# Patient Record
Sex: Male | Born: 1955 | Race: White | Hispanic: No | Marital: Single | Smoking: Never smoker
Health system: Southern US, Community
[De-identification: ages and names within clinical notes are randomized; demographics above are authoritative.]

## PROBLEM LIST (undated history)

## (undated) DIAGNOSIS — T884XXA Failed or difficult intubation, initial encounter: Secondary | ICD-10-CM

## (undated) DIAGNOSIS — Z8719 Personal history of other diseases of the digestive system: Secondary | ICD-10-CM

## (undated) DIAGNOSIS — K219 Gastro-esophageal reflux disease without esophagitis: Secondary | ICD-10-CM

## (undated) DIAGNOSIS — G4730 Sleep apnea, unspecified: Secondary | ICD-10-CM

## (undated) DIAGNOSIS — G4762 Sleep related leg cramps: Secondary | ICD-10-CM

## (undated) DIAGNOSIS — C801 Malignant (primary) neoplasm, unspecified: Secondary | ICD-10-CM

## (undated) DIAGNOSIS — G629 Polyneuropathy, unspecified: Secondary | ICD-10-CM

## (undated) HISTORY — DX: Sleep related leg cramps: G47.62

## (undated) HISTORY — PX: OTHER SURGICAL HISTORY: SHX169

## (undated) HISTORY — DX: Polyneuropathy, unspecified: G62.9

## (undated) HISTORY — PX: HIATAL HERNIA REPAIR: SHX195

---

## 1998-08-16 ENCOUNTER — Emergency Department (HOSPITAL_COMMUNITY): Admission: EM | Admit: 1998-08-16 | Discharge: 1998-08-16 | Payer: Self-pay | Admitting: Emergency Medicine

## 1998-08-16 ENCOUNTER — Encounter: Payer: Self-pay | Admitting: Emergency Medicine

## 1998-09-01 ENCOUNTER — Ambulatory Visit (HOSPITAL_COMMUNITY): Admission: RE | Admit: 1998-09-01 | Discharge: 1998-09-01 | Payer: Self-pay

## 1998-12-10 ENCOUNTER — Encounter: Admission: RE | Admit: 1998-12-10 | Discharge: 1999-01-26 | Payer: Self-pay

## 2002-03-14 ENCOUNTER — Ambulatory Visit (HOSPITAL_BASED_OUTPATIENT_CLINIC_OR_DEPARTMENT_OTHER): Admission: RE | Admit: 2002-03-14 | Discharge: 2002-03-14 | Payer: Self-pay | Admitting: Geriatric Medicine

## 2003-05-06 ENCOUNTER — Ambulatory Visit (HOSPITAL_BASED_OUTPATIENT_CLINIC_OR_DEPARTMENT_OTHER): Admission: RE | Admit: 2003-05-06 | Discharge: 2003-05-06 | Payer: Self-pay | Admitting: Geriatric Medicine

## 2006-07-13 ENCOUNTER — Emergency Department (HOSPITAL_COMMUNITY): Admission: EM | Admit: 2006-07-13 | Discharge: 2006-07-14 | Payer: Self-pay | Admitting: Emergency Medicine

## 2007-04-25 ENCOUNTER — Encounter: Admission: RE | Admit: 2007-04-25 | Discharge: 2007-04-25 | Payer: Self-pay

## 2007-08-27 ENCOUNTER — Ambulatory Visit (HOSPITAL_COMMUNITY): Admission: RE | Admit: 2007-08-27 | Discharge: 2007-08-27 | Payer: Self-pay | Admitting: Neurosurgery

## 2009-03-27 IMAGING — CT CT ANGIO HEAD
2 of 9 series · 6 of 33 positions shown · IV contrast (APPLIED)
Comparison: None.

CLINICAL DATA: EVAL: ANEURSYM/PT HAVING HEADACHES/FAMILY HX
ANEURSYM;

CT ANGIOGRAPHY HEAD
TECHNIQUE: Multidetector CT imaging of the brain was performed
during bolus injection of intravenous contrast. Multiplanar CT
angiographic image reconstructions were generated to evaluate
cerebral vasculature centered at the Circle of Willis
Contrast: Omnipaque 350, 100 ml

[Series 3: head w/o 4.8 h47s · axial · non-contrast · 0.47mm/px · z∈[-129,+19]mm · 3 of 30 slices shown]
[im 1/30  soft-tissue]
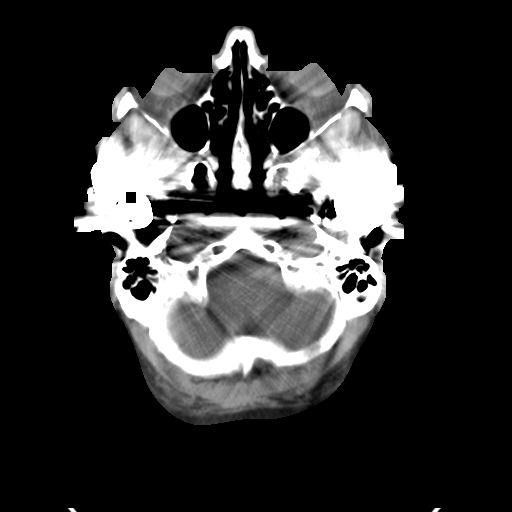
[im 15/30  bone]
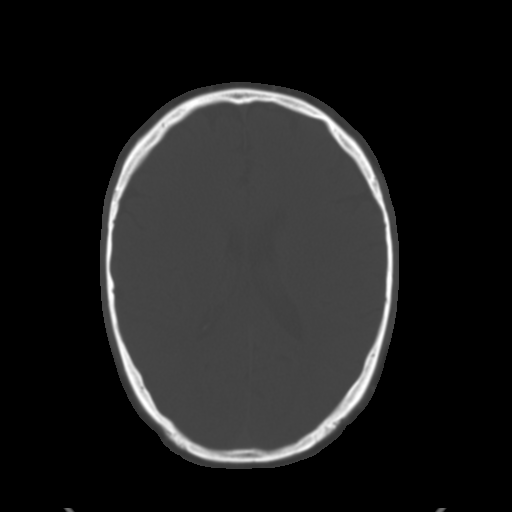
[im 30/30  soft-tissue]
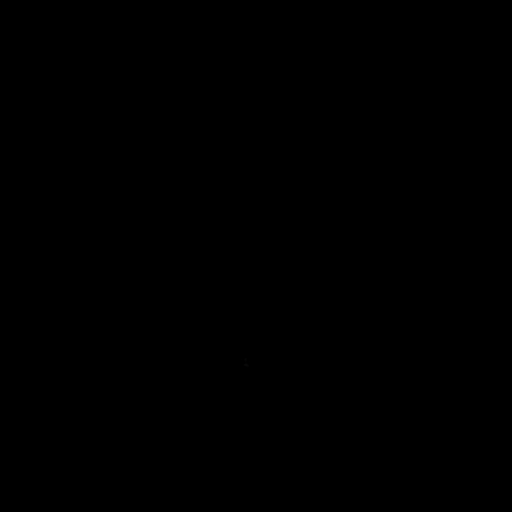

[Series 12: angio 2mm · axial · 0.43mm/px · z∈[-246,-186]mm · 3 of 62 slices shown]
[im 16/62  soft-tissue]
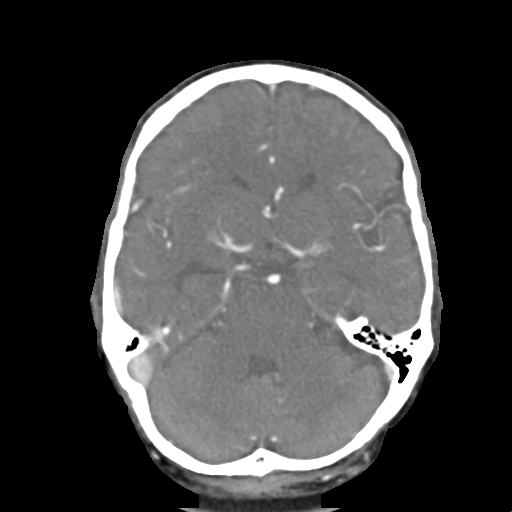
[im 31/62  soft-tissue]
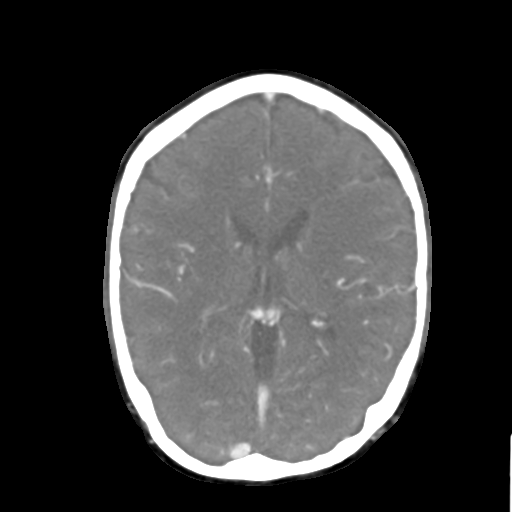
[im 46/62  soft-tissue]
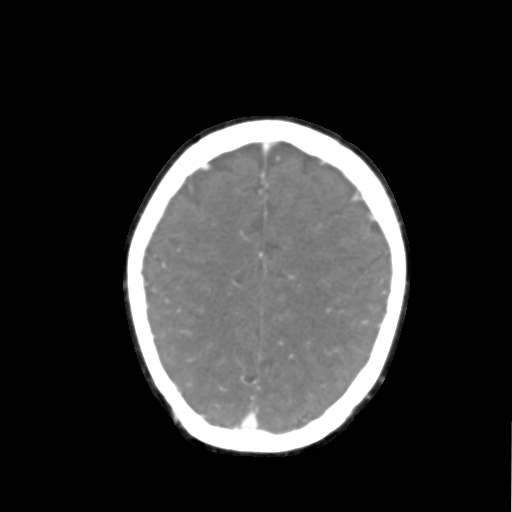

[6 of 33 positions shown; findings below may reference images not displayed]

FINDINGS: The patient has bilateral temporomandibular joint
prostheses, which required a slight modification and the technique
for CT angiography, in that the gantry was angled slightly.
Overall the study is diagnostic, although there is some mild
artifact obscuring the distal vertebral arteries near the skull
base.

There is marked the dolichoectasia of the vertebrobasilar system,
with the basilar artery projecting well into the right prepontine
cistern.  There is no visible aneurysm, or arteriovenous
malformation.  There is premature vascular calcification in the
carotid siphon regions bilaterally, given the patient's age of 51.
I see no visible proximal stenosis of the intracranial vasculature.

 Routine imaging the brain pre and post contrast demonstrates no
evidence for acute infarct, mass lesion, hydrocephalus, hemorrhage,
or extra-axial fluid.  There does appear to be mild atrophy
premature for the patient's age.  There is slight prominence of the
right temporal horns compared to the left, perhaps representing
asymmetric right temporal lobe volume loss.  There is no abnormal
enhancement post infusion.
IMPRESSION: Technically adequate study despite limitations related to bilateral
TMJ prosthesis.

No visible intracranial aneurysm or vascular malformation.

Moderate to marked dolichoectasia of the vertebral basilar system,
as well as premature atherosclerotic calcification of the carotid
siphons.

Mild premature atrophy, with asymmetric prominence of the right
temporal horn also noted.

## 2010-06-20 ENCOUNTER — Encounter: Payer: Self-pay | Admitting: Geriatric Medicine

## 2012-05-30 HISTORY — PX: LUMBAR DISC SURGERY: SHX700

## 2015-09-02 DIAGNOSIS — C44311 Basal cell carcinoma of skin of nose: Secondary | ICD-10-CM | POA: Diagnosis not present

## 2015-09-03 DIAGNOSIS — C44311 Basal cell carcinoma of skin of nose: Secondary | ICD-10-CM | POA: Diagnosis not present

## 2015-09-11 DIAGNOSIS — C4491 Basal cell carcinoma of skin, unspecified: Secondary | ICD-10-CM | POA: Diagnosis not present

## 2015-09-14 DIAGNOSIS — M950 Acquired deformity of nose: Secondary | ICD-10-CM | POA: Diagnosis not present

## 2015-09-14 DIAGNOSIS — Z85828 Personal history of other malignant neoplasm of skin: Secondary | ICD-10-CM | POA: Diagnosis not present

## 2015-09-18 DIAGNOSIS — C4491 Basal cell carcinoma of skin, unspecified: Secondary | ICD-10-CM | POA: Diagnosis not present

## 2015-09-18 DIAGNOSIS — M950 Acquired deformity of nose: Secondary | ICD-10-CM | POA: Diagnosis not present

## 2015-09-22 DIAGNOSIS — C4491 Basal cell carcinoma of skin, unspecified: Secondary | ICD-10-CM | POA: Diagnosis not present

## 2015-09-22 DIAGNOSIS — M950 Acquired deformity of nose: Secondary | ICD-10-CM | POA: Diagnosis not present

## 2015-09-24 DIAGNOSIS — C4491 Basal cell carcinoma of skin, unspecified: Secondary | ICD-10-CM | POA: Diagnosis not present

## 2015-09-24 DIAGNOSIS — M950 Acquired deformity of nose: Secondary | ICD-10-CM | POA: Diagnosis not present

## 2015-09-28 DIAGNOSIS — C4491 Basal cell carcinoma of skin, unspecified: Secondary | ICD-10-CM | POA: Diagnosis not present

## 2015-09-28 DIAGNOSIS — M950 Acquired deformity of nose: Secondary | ICD-10-CM | POA: Diagnosis not present

## 2015-10-05 DIAGNOSIS — M950 Acquired deformity of nose: Secondary | ICD-10-CM | POA: Diagnosis not present

## 2015-10-05 DIAGNOSIS — C4491 Basal cell carcinoma of skin, unspecified: Secondary | ICD-10-CM | POA: Diagnosis not present

## 2015-10-13 DIAGNOSIS — Z79899 Other long term (current) drug therapy: Secondary | ICD-10-CM | POA: Diagnosis not present

## 2015-10-13 DIAGNOSIS — E7800 Pure hypercholesterolemia, unspecified: Secondary | ICD-10-CM | POA: Diagnosis not present

## 2015-10-13 DIAGNOSIS — Z125 Encounter for screening for malignant neoplasm of prostate: Secondary | ICD-10-CM | POA: Diagnosis not present

## 2015-10-13 DIAGNOSIS — Z23 Encounter for immunization: Secondary | ICD-10-CM | POA: Diagnosis not present

## 2015-10-13 DIAGNOSIS — Z0000 Encounter for general adult medical examination without abnormal findings: Secondary | ICD-10-CM | POA: Diagnosis not present

## 2015-10-13 DIAGNOSIS — C4491 Basal cell carcinoma of skin, unspecified: Secondary | ICD-10-CM | POA: Diagnosis not present

## 2015-10-13 DIAGNOSIS — M950 Acquired deformity of nose: Secondary | ICD-10-CM | POA: Diagnosis not present

## 2015-10-27 DIAGNOSIS — M950 Acquired deformity of nose: Secondary | ICD-10-CM | POA: Diagnosis not present

## 2015-10-27 DIAGNOSIS — C4491 Basal cell carcinoma of skin, unspecified: Secondary | ICD-10-CM | POA: Diagnosis not present

## 2015-10-30 DIAGNOSIS — H01004 Unspecified blepharitis left upper eyelid: Secondary | ICD-10-CM | POA: Diagnosis not present

## 2015-10-30 DIAGNOSIS — H5203 Hypermetropia, bilateral: Secondary | ICD-10-CM | POA: Diagnosis not present

## 2015-10-30 DIAGNOSIS — H53002 Unspecified amblyopia, left eye: Secondary | ICD-10-CM | POA: Diagnosis not present

## 2015-10-30 DIAGNOSIS — H01001 Unspecified blepharitis right upper eyelid: Secondary | ICD-10-CM | POA: Diagnosis not present

## 2015-11-23 DIAGNOSIS — C4491 Basal cell carcinoma of skin, unspecified: Secondary | ICD-10-CM | POA: Diagnosis not present

## 2015-11-23 DIAGNOSIS — M950 Acquired deformity of nose: Secondary | ICD-10-CM | POA: Diagnosis not present

## 2015-12-22 DIAGNOSIS — C4491 Basal cell carcinoma of skin, unspecified: Secondary | ICD-10-CM | POA: Diagnosis not present

## 2015-12-22 DIAGNOSIS — M950 Acquired deformity of nose: Secondary | ICD-10-CM | POA: Diagnosis not present

## 2016-01-05 DIAGNOSIS — G4733 Obstructive sleep apnea (adult) (pediatric): Secondary | ICD-10-CM | POA: Diagnosis not present

## 2016-01-05 DIAGNOSIS — J343 Hypertrophy of nasal turbinates: Secondary | ICD-10-CM | POA: Diagnosis not present

## 2016-01-05 DIAGNOSIS — J342 Deviated nasal septum: Secondary | ICD-10-CM | POA: Diagnosis not present

## 2016-01-05 DIAGNOSIS — J302 Other seasonal allergic rhinitis: Secondary | ICD-10-CM | POA: Diagnosis not present

## 2016-02-23 DIAGNOSIS — L570 Actinic keratosis: Secondary | ICD-10-CM | POA: Diagnosis not present

## 2016-02-23 DIAGNOSIS — C44219 Basal cell carcinoma of skin of left ear and external auricular canal: Secondary | ICD-10-CM | POA: Diagnosis not present

## 2016-02-23 DIAGNOSIS — Z85828 Personal history of other malignant neoplasm of skin: Secondary | ICD-10-CM | POA: Diagnosis not present

## 2016-02-23 DIAGNOSIS — Z08 Encounter for follow-up examination after completed treatment for malignant neoplasm: Secondary | ICD-10-CM | POA: Diagnosis not present

## 2016-02-23 DIAGNOSIS — X32XXXD Exposure to sunlight, subsequent encounter: Secondary | ICD-10-CM | POA: Diagnosis not present

## 2016-03-30 DIAGNOSIS — L570 Actinic keratosis: Secondary | ICD-10-CM | POA: Diagnosis not present

## 2016-03-30 DIAGNOSIS — L718 Other rosacea: Secondary | ICD-10-CM | POA: Diagnosis not present

## 2016-03-30 DIAGNOSIS — X32XXXD Exposure to sunlight, subsequent encounter: Secondary | ICD-10-CM | POA: Diagnosis not present

## 2016-04-17 DIAGNOSIS — Z23 Encounter for immunization: Secondary | ICD-10-CM | POA: Diagnosis not present

## 2016-04-28 ENCOUNTER — Other Ambulatory Visit: Payer: Self-pay | Admitting: Gastroenterology

## 2016-05-16 ENCOUNTER — Encounter (HOSPITAL_COMMUNITY)
Admission: RE | Disposition: A | Discharge status: Home / Self Care | Payer: Self-pay | Source: Ambulatory Visit | Attending: Gastroenterology

## 2016-05-16 ENCOUNTER — Ambulatory Visit (HOSPITAL_COMMUNITY)
Admission: RE | Admit: 2016-05-16 | Discharge: 2016-05-16 | Disposition: A | Discharge status: Home / Self Care | Payer: BLUE CROSS/BLUE SHIELD | Source: Ambulatory Visit | Attending: Gastroenterology | Admitting: Gastroenterology

## 2016-05-16 ENCOUNTER — Ambulatory Visit (HOSPITAL_COMMUNITY): Payer: BLUE CROSS/BLUE SHIELD | Admitting: Anesthesiology

## 2016-05-16 ENCOUNTER — Encounter (HOSPITAL_COMMUNITY): Payer: Self-pay

## 2016-05-16 DIAGNOSIS — G4730 Sleep apnea, unspecified: Secondary | ICD-10-CM | POA: Diagnosis not present

## 2016-05-16 DIAGNOSIS — E7800 Pure hypercholesterolemia, unspecified: Secondary | ICD-10-CM | POA: Diagnosis not present

## 2016-05-16 DIAGNOSIS — G4733 Obstructive sleep apnea (adult) (pediatric): Secondary | ICD-10-CM | POA: Diagnosis not present

## 2016-05-16 DIAGNOSIS — K219 Gastro-esophageal reflux disease without esophagitis: Secondary | ICD-10-CM | POA: Insufficient documentation

## 2016-05-16 DIAGNOSIS — D125 Benign neoplasm of sigmoid colon: Secondary | ICD-10-CM | POA: Insufficient documentation

## 2016-05-16 DIAGNOSIS — Z1211 Encounter for screening for malignant neoplasm of colon: Secondary | ICD-10-CM | POA: Diagnosis not present

## 2016-05-16 DIAGNOSIS — D126 Benign neoplasm of colon, unspecified: Secondary | ICD-10-CM | POA: Diagnosis not present

## 2016-05-16 HISTORY — DX: Malignant (primary) neoplasm, unspecified: C80.1

## 2016-05-16 HISTORY — DX: Sleep apnea, unspecified: G47.30

## 2016-05-16 HISTORY — DX: Gastro-esophageal reflux disease without esophagitis: K21.9

## 2016-05-16 HISTORY — DX: Personal history of other diseases of the digestive system: Z87.19

## 2016-05-16 HISTORY — PX: COLONOSCOPY WITH PROPOFOL: SHX5780

## 2016-05-16 HISTORY — DX: Failed or difficult intubation, initial encounter: T88.4XXA

## 2016-05-16 SURGERY — COLONOSCOPY WITH PROPOFOL
Anesthesia: Monitor Anesthesia Care

## 2016-05-16 MED ORDER — LACTATED RINGERS IV SOLN
INTRAVENOUS | Status: DC
  Administered 2016-05-16: 13:00:00 via INTRAVENOUS
  Administered 2016-05-16: 1000 mL via INTRAVENOUS

## 2016-05-16 MED ORDER — SODIUM CHLORIDE 0.9 % IV SOLN: INTRAVENOUS | Status: DC

## 2016-05-16 MED ORDER — PROPOFOL 500 MG/50ML IV EMUL
INTRAVENOUS | Status: DC | PRN
  Administered 2016-05-16: 150 ug/kg/min via INTRAVENOUS

## 2016-05-16 MED ORDER — PROPOFOL 10 MG/ML IV BOLUS
INTRAVENOUS | Status: AC
  Filled 2016-05-16: qty 40

## 2016-05-16 MED ORDER — PROPOFOL 500 MG/50ML IV EMUL
INTRAVENOUS | Status: DC | PRN
  Administered 2016-05-16 (×2): 50 mg via INTRAVENOUS

## 2016-05-16 MED ORDER — PROPOFOL 10 MG/ML IV BOLUS
INTRAVENOUS | Status: AC
  Filled 2016-05-16: qty 20

## 2016-05-16 SURGICAL SUPPLY — 22 items

## 2016-05-16 NOTE — Anesthesia Preprocedure Evaluation (Addendum)
Anesthesia Evaluation  Patient identified by MRN, date of birth, ID band Patient awake    Reviewed: Allergy & Precautions, NPO status , Patient's Chart, lab work & pertinent test results  History of Anesthesia Complications (+) DIFFICULT AIRWAYHistory of anesthetic complications: limited mouth opening s/p TMJ surgery.  Airway Mallampati: III   Neck ROM: Full  Mouth opening: Limited Mouth Opening  Dental  (+) Teeth Intact, Dental Advisory Given, Caps   Pulmonary sleep apnea and Continuous Positive Airway Pressure Ventilation ,    breath sounds clear to auscultation       Cardiovascular (-) anginanegative cardio ROS   Rhythm:Regular Rate:Normal     Neuro/Psych negative neurological ROS     GI/Hepatic GERD  Medicated and Controlled,  Endo/Other  negative endocrine ROS  Renal/GU negative Renal ROS     Musculoskeletal   Abdominal   Peds  Hematology negative hematology ROS (+)   Anesthesia Other Findings   Reproductive/Obstetrics                            Anesthesia Physical Anesthesia Plan  ASA: III  Anesthesia Plan: MAC   Post-op Pain Management:    Induction: Intravenous  Airway Management Planned: Natural Airway and Nasal Cannula  Additional Equipment:   Intra-op Plan:   Post-operative Plan:   Informed Consent: I have reviewed the patients History and Physical, chart, labs and discussed the procedure including the risks, benefits and alternatives for the proposed anesthesia with the patient or authorized representative who has indicated his/her understanding and acceptance.   Dental advisory given  Plan Discussed with: CRNA and Surgeon  Anesthesia Plan Comments: (Plan routine monitors, MAC)        Anesthesia Quick Evaluation

## 2016-05-16 NOTE — Op Note (Signed)
[facility] Patient Name: Andrew [name] Procedure Date: 05/16/2016 MRN: [id number] Attending MD: Garlan Fair , MD Date of Birth: [birth year] CSN: [id number] Age: 60 Admit Type: Outpatient Procedure:                Colonoscopy Indications:              Screening for colorectal malignant neoplasm. Normal                            screening colonoscopy was performed on 04/06/2006. Providers:                Garlan Fair, MD, Carolynn Comment, RN, Ralene Bathe, Technician, Arnoldo Hooker, CRNA Referring MD:              Medicines:                Propofol per Anesthesia Complications:            No immediate complications. Estimated Blood Loss:     Estimated blood loss: none. Procedure:                Pre-Anesthesia Assessment:                           - Prior to the procedure, a History and Physical                            was performed, and patient medications and                            allergies were reviewed. The patient's tolerance of                            previous anesthesia was also reviewed. The risks                            and benefits of the procedure and the sedation                            options and risks were discussed with the patient.                            All questions were answered, and informed consent                            was obtained. Prior Anticoagulants: The patient has                            taken aspirin, last dose was 1 day prior to                            procedure. ASA Grade Assessment: II - A patient  with mild systemic disease. After reviewing the                            risks and benefits, the patient was deemed in                            satisfactory condition to undergo the procedure.                           After obtaining informed consent, the colonoscope                            was passed under direct vision. Throughout the                procedure, the patient's blood pressure, pulse, and                            oxygen saturations were monitored continuously. The                            [id number] scope was introduced through                            the anus and advanced to the the cecum, identified                            by appendiceal orifice and ileocecal valve. The                            colonoscopy was performed without difficulty. The                            patient tolerated the procedure well. The quality                            of the bowel preparation was adequate. The                            appendiceal orifice and the rectum were                            photographed. Scope In: 1:29:24 PM Scope Out: 1:55:09 PM Scope Withdrawal Time: 0 hours 18 minutes 37 seconds  Total Procedure Duration: 0 hours 25 minutes 45 seconds  Findings:      The perianal and digital rectal examinations were normal.      A 4 mm polyp was found in the distal sigmoid colon. The polyp was       sessile. The polyp was removed with a cold snare. Resection and       retrieval were complete.      The exam was otherwise without abnormality. Impression:               - One 4 mm polyp in the distal sigmoid colon,  removed with a cold snare. Resected and retrieved.                           - The examination was otherwise normal. Moderate Sedation:      N/A- Per Anesthesia Care Recommendation:           - Patient has a contact number available for                            emergencies. The signs and symptoms of potential                            delayed complications were discussed with the                            patient. Return to normal activities tomorrow.                            Written discharge instructions were provided to the                            patient.                           - Repeat colonoscopy date to be determined after                             pending pathology results are reviewed for                            surveillance.                           - Resume previous diet.                           - Continue present medications. Procedure Code(s):        --- Professional ---                           [phone], Colonoscopy, flexible; with removal of                            tumor(s), polyp(s), or other lesion(s) by snare                            technique Diagnosis Code(s):        --- Professional ---                           Z12.11, Encounter for screening for malignant                            neoplasm of colon                           D12.5, Benign neoplasm of sigmoid colon  CPT copyright 2016 [employer]. All rights reserved. The codes documented in this report are preliminary and upon coder review may  be revised to meet current compliance requirements. Earle Gell, MD Garlan Fair, MD 05/16/2016 2:01:05 PM This report has been signed electronically. Number of Addenda: 0

## 2016-05-16 NOTE — Transfer of Care (Signed)
Immediate Anesthesia Transfer of Care Note  Patient: Andrew [name]  Procedure(s) Performed: Procedure(s): COLONOSCOPY WITH PROPOFOL (N/A)  Patient Location: PACU  Anesthesia Type:MAC  Level of Consciousness:  sedated, patient cooperative and responds to stimulation  Airway & Oxygen Therapy:Patient Spontanous Breathing and Patient connected to face mask oxgen  Post-op Assessment:  Report given to PACU RN and Post -op Vital signs reviewed and stable  Post vital signs:  Reviewed and stable  Last Vitals:  Vitals:   05/16/16 1237  BP: (!) 135/94  Pulse: 83  Resp: 14  Temp: [id number] C    Complications: No apparent anesthesia complications

## 2016-05-16 NOTE — H&P (Signed)
Procedure: Screening colonoscopy. Normal screening colonoscopy was performed on 04/06/2006  History: The patient is a 60 year old male born 02/02/1956. He is scheduled to undergo a repeat screening colonoscopy today  Past medical history: Laparoscopic hernia repair. TMJ surgery with implants. Lumbar disc surgery. Hypercholesterolemia. Allergic rhinitis. Obstructive sleep apnea. Gastroesophageal reflux.  Exam: The patient is alert and lying comfortably on the endoscopy stretcher. Abdomen is soft and nontender to palpation. Lungs are clear to auscultation. Cardiac exam reveals a regular rhythm.  Plan: Proceed with screening colonoscopy

## 2016-05-16 NOTE — Discharge Instructions (Signed)

## 2016-05-16 NOTE — Anesthesia Postprocedure Evaluation (Signed)
Anesthesia Post Note  Patient: Andrew [name]  Procedure(s) Performed: Procedure(s) (LRB): COLONOSCOPY WITH PROPOFOL (N/A)  Patient location during evaluation: Endoscopy Anesthesia Type: MAC Level of consciousness: awake and alert, oriented and patient cooperative Pain management: pain level controlled Vital Signs Assessment: post-procedure vital signs reviewed and stable Respiratory status: spontaneous breathing, nonlabored ventilation and respiratory function stable Cardiovascular status: blood pressure returned to baseline and stable Postop Assessment: no signs of nausea or vomiting Anesthetic complications: no       Last Vitals:  Vitals:   05/16/16 1237 05/16/16 1400  BP: (!) 135/94 135/89  Pulse: 83 76  Resp: 14 16  Temp: 36.8 C 36.5 C    Last Pain:  Vitals:   05/16/16 1400  TempSrc: Oral                 Correen Bubolz,E. Arvon Schreiner

## 2016-05-17 ENCOUNTER — Encounter (HOSPITAL_COMMUNITY): Payer: Self-pay | Admitting: Gastroenterology

## 2016-08-16 DIAGNOSIS — H01001 Unspecified blepharitis right upper eyelid: Secondary | ICD-10-CM | POA: Diagnosis not present

## 2016-08-16 DIAGNOSIS — H04123 Dry eye syndrome of bilateral lacrimal glands: Secondary | ICD-10-CM | POA: Diagnosis not present

## 2016-08-16 DIAGNOSIS — H01004 Unspecified blepharitis left upper eyelid: Secondary | ICD-10-CM | POA: Diagnosis not present

## 2016-08-16 DIAGNOSIS — H01002 Unspecified blepharitis right lower eyelid: Secondary | ICD-10-CM | POA: Diagnosis not present

## 2016-10-14 DIAGNOSIS — Z0000 Encounter for general adult medical examination without abnormal findings: Secondary | ICD-10-CM | POA: Diagnosis not present

## 2016-10-14 DIAGNOSIS — E7800 Pure hypercholesterolemia, unspecified: Secondary | ICD-10-CM | POA: Diagnosis not present

## 2016-10-14 DIAGNOSIS — K219 Gastro-esophageal reflux disease without esophagitis: Secondary | ICD-10-CM | POA: Diagnosis not present

## 2016-10-14 DIAGNOSIS — Z79899 Other long term (current) drug therapy: Secondary | ICD-10-CM | POA: Diagnosis not present

## 2016-10-14 DIAGNOSIS — G4733 Obstructive sleep apnea (adult) (pediatric): Secondary | ICD-10-CM | POA: Diagnosis not present

## 2016-10-14 DIAGNOSIS — Z125 Encounter for screening for malignant neoplasm of prostate: Secondary | ICD-10-CM | POA: Diagnosis not present

## 2016-10-26 DIAGNOSIS — G629 Polyneuropathy, unspecified: Secondary | ICD-10-CM | POA: Diagnosis not present

## 2016-12-05 ENCOUNTER — Ambulatory Visit (INDEPENDENT_AMBULATORY_CARE_PROVIDER_SITE_OTHER): Payer: BLUE CROSS/BLUE SHIELD | Admitting: Neurology

## 2016-12-05 ENCOUNTER — Encounter (INDEPENDENT_AMBULATORY_CARE_PROVIDER_SITE_OTHER): Payer: Self-pay

## 2016-12-05 ENCOUNTER — Encounter: Payer: Self-pay | Admitting: Neurology

## 2016-12-05 DIAGNOSIS — G609 Hereditary and idiopathic neuropathy, unspecified: Secondary | ICD-10-CM | POA: Diagnosis not present

## 2016-12-05 DIAGNOSIS — G629 Polyneuropathy, unspecified: Secondary | ICD-10-CM

## 2016-12-05 HISTORY — DX: Polyneuropathy, unspecified: G62.9

## 2016-12-05 MED ORDER — GABAPENTIN 300 MG PO CAPS: 300.0000 mg | ORAL_CAPSULE | Freq: Two times a day (BID) | ORAL | 3 refills | Status: DC

## 2016-12-05 NOTE — Progress Notes (Addendum)
Reason for visit: Peripheral neuropathy  Referring physician: Dr. Wendie Agreste is a 61 y.o. male  History of present illness:  Mr. 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 The patient had surgery in 2015, he had noted some numbness and tingling sensations in the feet for about a year prior to surgery, but the numbness has worsened since the surgery. The patient has tingling in both feet and some sensory alteration across the ankles in the lower legs. The patient has numbness mainly in the distal portions of the feet on the top and bottoms. The right and the left foot are equally affected. The patient denies any weakness of the legs, he does have some fasciculations that have been present for 8 or 9 years involving the gastrocnemius muscles bilaterally. The patient reports some chronic neck pain, he denies paresthesias in the hands. The patient occasionally may have some left leg spasms that may occur at nighttime. He denies any balance issues or difficulty controlling the bowels or the bladder. He denies any family history of peripheral neuropathies. He did undergo nerve conduction studies done through the [facility] which revealed evidence of normal sensory and motor latencies but the amplitudes for the sensory latencies were slightly low, the F wave latencies were prolonged. The patient was diagnosed with an early peripheral neuropathy. The patient does report some numbness as well as occasional burning and shooting pain, he claims that the discomfort during the day and at night is about equal. He is sent to this office for an evaluation.  Past Medical History:  Diagnosis Date  . Cancer ([place])    skin cancer on nose 2017  . Difficult intubation    Needs to use pediatric intubation due to TMJ surgery  . GERD (gastroesophageal reflux  disease)   . History of hiatal hernia   . Sleep apnea     Past Surgical History:  Procedure Laterality Date  . COLONOSCOPY WITH PROPOFOL N/A 05/16/2016   Procedure: COLONOSCOPY WITH PROPOFOL;  Surgeon: Garlan Fair, MD;  Location: [facility] END[facility]COPY;  Service: Endoscopy;  Laterality: N/A;  . HIATAL HERNIA REPAIR     14 years ago  . LUMBAR DISC SURGERY  2014   L4  . total TMJ replacement Bilateral    15-18 years ago    Family History  Problem Relation Age of Onset  . Anuerysm Mother     Social history:  reports that he has never smoked. He has never used smokeless tobacco. He reports that he drinks alcohol. He reports that he does not use drugs.  Medications:  Prior to Admission medications   Medication Sig Start Date End Date Taking? Authorizing Provider  aspirin 81 MG tablet Take 81 mg by mouth daily.   Yes [provider]  Cholecalciferol (VITAMIN D3) 1000 units CAPS Take 3,000 Units by mouth daily.   Yes [provider]  Coenzyme Q10 (COQ10) 100 MG CAPS Take 1 capsule by mouth daily.   Yes [provider]  fluticasone (FLONASE) 50 MCG/ACT nasal spray Place 2 sprays into both nostrils daily as needed for allergies or rhinitis.   Yes [provider]  Magnesium 250 MG TABS Take 1 tablet by mouth daily.   Yes [provider]  Multiple Vitamins-Minerals (MULTIVITAMIN WITH MINERALS) tablet Take 1 tablet by mouth daily.   Yes  [provider]  Omega-3 Fatty Acids (FISH OIL PO) Take 1,000 mg by mouth 2 (two) times daily.   Yes [provider]  omeprazole (PRILOSEC) 40 MG capsule Take 40 mg by mouth daily.   Yes [provider]  Selenium 200 MCG TABS Take 1 tablet by mouth daily.   Yes [provider]  simvastatin (ZOCOR) 80 MG tablet Take 80 mg by mouth daily.   Yes [provider]  vitamin C (ASCORBIC ACID) 500 MG tablet Take 500-1,000 mg by mouth daily.   Yes [provider]  zinc  gluconate 50 MG tablet Take 50 mg by mouth daily.   Yes [provider]  zolpidem (AMBIEN CR) 6.25 MG CR tablet Take 6.25 mg by mouth at bedtime as needed for sleep.   Yes [provider]      Allergies  Allergen Reactions  . Other Anaphylaxis    Watermelon - Anaphylactic  Bananas - Inside of mouth itch  . Erythromycin Itching    ROS:  Out of a complete 14 system review of symptoms, the patient complains only of the following symptoms, and all other reviewed systems are negative.  Numbness  Blood pressure 135/79, pulse 81, height 6\' 2"  (1.88 m), weight 221 lb (100.2 kg).  Physical Exam  General: The patient is alert and cooperative at the time of the examination.  Eyes: Pupils are equal, round, and reactive to light. Discs are flat bilaterally.  Neck: The neck is supple, no carotid bruits are noted.  Respiratory: The respiratory examination is clear.  Cardiovascular: The cardiovascular examination reveals a regular rate and rhythm, no obvious murmurs or rubs are noted.  Skin: Extremities are without significant edema.  Neurologic Exam  Mental status: The patient is alert and oriented x 3 at the time of the examination. The patient has apparent normal recent and remote memory, with an apparently normal attention span and concentration ability.  Cranial nerves: Facial symmetry is present. There is good sensation of the face to pinprick and soft touch bilaterally. The strength of the facial muscles and the muscles to head turning and shoulder shrug are normal bilaterally. Speech is well enunciated, no aphasia or dysarthria is noted. Extraocular movements are full. Visual fields are full. The tongue is midline, and the patient has symmetric elevation of the soft palate. No obvious hearing deficits are noted.  Motor: The motor testing reveals 5 over 5 strength of all 4 extremities. Good symmetric motor tone is noted throughout.  Sensory: Sensory testing is  intact to pinprick, soft touch, vibration sensation, and position sense on all 4 extremities. No evidence of extinction is noted.  Coordination: Cerebellar testing reveals good finger-nose-finger and heel-to-shin bilaterally.  Gait and station: Gait is normal. Tandem gait is normal. Romberg is negative. No drift is seen. The patient is able to walk on heels and the toes bilaterally.  Reflexes: Deep tendon reflexes are symmetric and normal bilaterally. The ankle jerk reflexes are well-maintained bilaterally. Toes are downgoing bilaterally.   Assessment/Plan:  1. Mild peripheral neuropathy  The patient did have prior lumbosacral spine surgery, but the patient did not have spinal stenosis on a prior scan done on 08/19/2013, this study was reviewed online. The patient will be sent for blood work today, he is on zinc supplementation, the copper levels will need to be checked. A recent vitamin B12 level was normal. The patient was placed on gabapentin taking 300 mg twice daily. The patient may primarily have a small fiber  neuropathy that does not show up well on nerve conduction studies. EMG evaluation was normal. The patient does have neck pain. MRI evaluation of the spine may be done future depending upon his evolution of symptoms. He will follow-up in 4 or 5 months.  Jill Alexanders MD 12/05/2016 8:38 AM  [facility] [address] [place], [place] [address]  Phone [phone] Fax [phone]

## 2016-12-05 NOTE — Patient Instructions (Signed)
   We will start gabapentin 300 mg twice a day.  Neurontin (gabapentin) may result in drowsiness, ankle swelling, gait instability, or possibly dizziness. Please contact our office if significant side effects occur with this medication.

## 2016-12-06 ENCOUNTER — Telehealth: Payer: Self-pay | Admitting: Neurology

## 2016-12-06 DIAGNOSIS — X32XXXD Exposure to sunlight, subsequent encounter: Secondary | ICD-10-CM | POA: Diagnosis not present

## 2016-12-06 DIAGNOSIS — L570 Actinic keratosis: Secondary | ICD-10-CM | POA: Diagnosis not present

## 2016-12-06 DIAGNOSIS — B078 Other viral warts: Secondary | ICD-10-CM | POA: Diagnosis not present

## 2016-12-06 DIAGNOSIS — Z08 Encounter for follow-up examination after completed treatment for malignant neoplasm: Secondary | ICD-10-CM | POA: Diagnosis not present

## 2016-12-06 DIAGNOSIS — Z85828 Personal history of other malignant neoplasm of skin: Secondary | ICD-10-CM | POA: Diagnosis not present

## 2016-12-06 LAB — B. BURGDORFI ANTIBODIES

## 2016-12-06 LAB — MULTIPLE MYELOMA PANEL, SERUM
ALBUMIN/GLOB SERPL: 1.7 (ref 0.7–1.7)
ALPHA 1: 0.2 g/dL (ref 0.0–0.4)
ALPHA2 GLOB SERPL ELPH-MCNC: 0.5 g/dL (ref 0.4–1.0)
Albumin SerPl Elph-Mcnc: 4.1 g/dL (ref 2.9–4.4)
B-GLOBULIN SERPL ELPH-MCNC: 1 g/dL (ref 0.7–1.3)
Gamma Glob SerPl Elph-Mcnc: 0.8 g/dL (ref 0.4–1.8)
Globulin, Total: 2.5 g/dL (ref 2.2–3.9)
IGM (IMMUNOGLOBULIN M), SRM: 32 mg/dL (ref 20–172)
IgA/Immunoglobulin A, Serum: 133 mg/dL (ref 61–437)
IgG (Immunoglobin G), Serum: 738 mg/dL (ref 700–1600)
TOTAL PROTEIN: 6.6 g/dL (ref 6.0–8.5)

## 2016-12-06 LAB — ANA W/REFLEX: ANA: NEGATIVE

## 2016-12-06 LAB — COPPER, SERUM: COPPER: 94 ug/dL (ref 72–166)

## 2016-12-06 LAB — RHEUMATOID FACTOR: Rheumatoid fact SerPl-aCnc: <10 IU/mL (ref 0.0–13.9)

## 2016-12-06 LAB — SEDIMENTATION RATE: Sed Rate: 2 mm/hr (ref 0–30)

## 2016-12-06 LAB — ANGIOTENSIN CONVERTING ENZYME: Angio Convert Enzyme: 62 U/L (ref 14–82)

## 2016-12-06 NOTE — Telephone Encounter (Signed)
I called the patient. The blood work was unremarkable, the prior MRI of the low back showed a disc at the L4-5 level involving the L5 nerve root on the left.  I doubt that the patient has significant spinal stenosis causing the symptoms. He is doing well on gabapentin, we will gradually increase the dose as time goes on.

## 2016-12-23 ENCOUNTER — Telehealth: Payer: Self-pay | Admitting: Neurology

## 2016-12-23 MED ORDER — GABAPENTIN 300 MG PO CAPS: ORAL_CAPSULE | ORAL | 3 refills | Status: DC

## 2016-12-23 NOTE — Telephone Encounter (Signed)
Pt called the office said since gabapentin was increased to 3 capsules/day but he is still having tingling and pain in toes and feet. He is wanting to know if it should be increased. Pharmacy: [facility] on [address] has closed

## 2016-12-23 NOTE — Telephone Encounter (Signed)
I called patient. He is tolerating the gabapentin at 300 mg 3 times daily, still needs increased dose, we will go to 3 mg twice during the day, 2 at night.  A new prescription for gabapentin was sent in.

## 2017-02-01 MED ORDER — GABAPENTIN 600 MG PO TABS: 600.0000 mg | ORAL_TABLET | Freq: Three times a day (TID) | ORAL | 3 refills | Status: DC

## 2017-02-01 NOTE — Telephone Encounter (Signed)
I called patient. He is getting some benefit on 1200 mg of gabapentin daily, we will go up to 1800 mg daily taking 600 mg 3 times daily.  So far he is tolerating medication well.

## 2017-02-01 NOTE — Addendum Note (Signed)
Addended by: Kathrynn Ducking on: 02/01/2017 02:04 PM   Modules accepted: Orders

## 2017-02-01 NOTE — Telephone Encounter (Signed)
Patient is calling to discuss increasing the dosage of gabapentin (NEURONTIN) 300 MG capsule. The new dosage is not helping with the pain. He uses [facility] on [address].

## 2017-02-14 DIAGNOSIS — E7800 Pure hypercholesterolemia, unspecified: Secondary | ICD-10-CM | POA: Diagnosis not present

## 2017-04-04 ENCOUNTER — Encounter: Payer: Self-pay | Admitting: Neurology

## 2017-04-04 ENCOUNTER — Ambulatory Visit: Payer: BLUE CROSS/BLUE SHIELD | Admitting: Neurology

## 2017-04-04 VITALS — BP 116/78 | HR 71 | Ht 74.0 in | Wt 224.0 lb

## 2017-04-04 DIAGNOSIS — G4762 Sleep related leg cramps: Secondary | ICD-10-CM

## 2017-04-04 DIAGNOSIS — G609 Hereditary and idiopathic neuropathy, unspecified: Secondary | ICD-10-CM | POA: Diagnosis not present

## 2017-04-04 HISTORY — DX: Sleep related leg cramps: G47.62

## 2017-04-04 MED ORDER — GABAPENTIN 300 MG PO CAPS
300.0000 mg | ORAL_CAPSULE | Freq: Two times a day (BID) | ORAL | 3 refills | Status: AC
Start: 2017-04-04 — End: ?

## 2017-04-04 MED ORDER — GABAPENTIN 600 MG PO TABS: 600.0000 mg | ORAL_TABLET | Freq: Three times a day (TID) | ORAL | 3 refills | Status: DC

## 2017-04-04 NOTE — Progress Notes (Signed)
Reason for visit: Peripheral neuropathy  Andrew [name] is an 61 y.o. male  History of present illness:  Andrew [name] is a 60 year old right-handed white male with a history of a mild peripheral neuropathy associated with dysesthesias involving the feet, occasional lancinating pains are noted.  The patient is doing fairly well when he takes around 2100 mg of gabapentin daily.  He has more discomfort when he is inactive.  He denies any significant balance changes, he has gained 3 pounds on gabapentin so far.  The patient has noted an increase in appetite.  He also has had some problems with nocturnal leg cramps that occur 2 or 3 times a week.  The cramps usually involve the left leg, the calf muscles involved.  The patient has had prior lumbosacral spine surgery.  He does climb ladders at work at times, he has not noted any significant balance changes.  He is tolerating the gabapentin well.  Past Medical History:  Diagnosis Date  . Cancer ([place])    skin cancer on nose 2017  . Difficult intubation    Needs to use pediatric intubation due to TMJ surgery  . GERD (gastroesophageal reflux disease)   . History of hiatal hernia   . Peripheral neuropathy 12/05/2016  . Sleep apnea     Past Surgical History:  Procedure Laterality Date  . HIATAL HERNIA REPAIR     14 years ago  . LUMBAR DISC SURGERY  2014   L4  . total TMJ replacement Bilateral    15-18 years ago    Family History  Problem Relation Age of Onset  . Anuerysm Mother     Social history:  reports that  has never smoked. he has never used smokeless tobacco. He reports that he drinks alcohol. He reports that he does not use drugs.    Allergies  Allergen Reactions  . Other Anaphylaxis    Watermelon - Anaphylactic  Bananas - Inside of mouth itch  . Erythromycin Itching    Medications:  Prior to Admission medications   Medication Sig Start Date End Date Taking? Authorizing Provider  aspirin 81 MG tablet Take 81 mg by mouth  daily.   Yes [provider]  Cholecalciferol (VITAMIN D3) 1000 units CAPS Take 3,000 Units by mouth daily.   Yes [provider]  Coenzyme Q10 (COQ10) 100 MG CAPS Take 1 capsule by mouth daily.   Yes [provider]  fluticasone (FLONASE) 50 MCG/ACT nasal spray Place 2 sprays into both nostrils daily as needed for allergies or rhinitis.   Yes [provider]  gabapentin (NEURONTIN) 600 MG tablet Take 1 tablet (600 mg total) by mouth 3 (three) times daily. 02/01/17  Yes Kathrynn Ducking, MD  Magnesium 250 MG TABS Take 1 tablet by mouth daily.   Yes [provider]  Multiple Vitamins-Minerals (MULTIVITAMIN WITH MINERALS) tablet Take 1 tablet by mouth daily.   Yes [provider]  Omega-3 Fatty Acids (FISH OIL PO) Take 1,000 mg by mouth 2 (two) times daily.   Yes [provider]  omeprazole (PRILOSEC) 40 MG capsule Take 40 mg by mouth daily.   Yes [provider]  Selenium 200 MCG TABS Take 1 tablet by mouth daily.   Yes [provider]  simvastatin (ZOCOR) 80 MG tablet Take 80 mg by mouth daily.   Yes [provider]  vitamin C (ASCORBIC ACID) 500 MG tablet Take 500-1,000 mg by mouth daily.   Yes [provider]  zinc gluconate 50 MG tablet Take 50 mg by mouth daily.   Yes [provider]  zolpidem (AMBIEN CR) 6.25 MG CR tablet Take 6.25 mg by mouth at bedtime as needed for sleep.   Yes [provider]    ROS:  Out of a complete 14 system review of symptoms, the patient complains only of the following symptoms, and all other reviewed systems are negative.  Foot pain Leg cramps  Blood pressure 116/78, pulse 71, height 6\' 2"  (1.88 m), weight 224 lb (101.6 kg).  Physical Exam  General: The patient is alert and cooperative at the time of the examination.  Skin: No significant peripheral edema is noted.   Neurologic Exam  Mental status: The patient is alert and oriented x 3  at the time of the examination. The patient has apparent normal recent and remote memory, with an apparently normal attention span and concentration ability.   Cranial nerves: Facial symmetry is present. Speech is normal, no aphasia or dysarthria is noted. Extraocular movements are full. Visual fields are full.  Motor: The patient has good strength in all 4 extremities.  Sensory examination: Soft touch sensation is symmetric on the face, arms, and legs.  Coordination: The patient has good finger-nose-finger and heel-to-shin bilaterally.  Gait and station: The patient has a normal gait. Tandem gait is normal. Romberg is negative. No drift is seen.  Reflexes: Deep tendon reflexes are symmetric.   Assessment/Plan:  1.  Peripheral neuropathy, mild  2.  Nocturnal leg cramps  The patient may take magnesium supplementation for the leg cramps, if this is not effective he will contact our office and we will consider the use of baclofen.  The patient will be given a prescription for gabapentin taking 600 mg 3 times daily, he was also given a prescription for the 300 mg capsules taking 1 up to twice a day if needed.  He will follow-up in 6 months, sooner if needed.  Jill Alexanders MD 04/04/2017 7:26 AM  [facility] [address] [place],  [address]  Phone [phone] Fax [phone]

## 2017-09-20 ENCOUNTER — Telehealth: Payer: Self-pay | Admitting: Neurology

## 2017-09-20 NOTE — Telephone Encounter (Signed)
Called patient to r/s 5/8 due to provider being out of the office. Patient states he will call us back to r/s if needed.

## 2017-10-04 ENCOUNTER — Ambulatory Visit: Payer: BLUE CROSS/BLUE SHIELD | Admitting: Neurology

## 2017-11-03 DIAGNOSIS — Z125 Encounter for screening for malignant neoplasm of prostate: Secondary | ICD-10-CM | POA: Diagnosis not present

## 2017-11-03 DIAGNOSIS — Z0000 Encounter for general adult medical examination without abnormal findings: Secondary | ICD-10-CM | POA: Diagnosis not present

## 2017-11-03 DIAGNOSIS — Z79899 Other long term (current) drug therapy: Secondary | ICD-10-CM | POA: Diagnosis not present

## 2017-11-03 DIAGNOSIS — E7800 Pure hypercholesterolemia, unspecified: Secondary | ICD-10-CM | POA: Diagnosis not present

## 2017-11-29 DIAGNOSIS — X32XXXD Exposure to sunlight, subsequent encounter: Secondary | ICD-10-CM | POA: Diagnosis not present

## 2017-11-29 DIAGNOSIS — C44311 Basal cell carcinoma of skin of nose: Secondary | ICD-10-CM | POA: Diagnosis not present

## 2017-11-29 DIAGNOSIS — Z1283 Encounter for screening for malignant neoplasm of skin: Secondary | ICD-10-CM | POA: Diagnosis not present

## 2017-11-29 DIAGNOSIS — L570 Actinic keratosis: Secondary | ICD-10-CM | POA: Diagnosis not present

## 2017-12-08 DIAGNOSIS — Z85828 Personal history of other malignant neoplasm of skin: Secondary | ICD-10-CM | POA: Diagnosis not present

## 2017-12-08 DIAGNOSIS — L905 Scar conditions and fibrosis of skin: Secondary | ICD-10-CM | POA: Diagnosis not present

## 2018-01-20 ENCOUNTER — Other Ambulatory Visit: Payer: Self-pay | Admitting: Neurology

## 2018-04-16 DIAGNOSIS — G4733 Obstructive sleep apnea (adult) (pediatric): Secondary | ICD-10-CM | POA: Diagnosis not present

## 2018-05-25 DIAGNOSIS — H25813 Combined forms of age-related cataract, bilateral: Secondary | ICD-10-CM | POA: Diagnosis not present

## 2018-05-25 DIAGNOSIS — H524 Presbyopia: Secondary | ICD-10-CM | POA: Diagnosis not present

## 2018-05-25 DIAGNOSIS — H0100A Unspecified blepharitis right eye, upper and lower eyelids: Secondary | ICD-10-CM | POA: Diagnosis not present

## 2018-05-25 DIAGNOSIS — H53002 Unspecified amblyopia, left eye: Secondary | ICD-10-CM | POA: Diagnosis not present

## 2018-06-22 DIAGNOSIS — R42 Dizziness and giddiness: Secondary | ICD-10-CM | POA: Diagnosis not present

## 2018-06-22 DIAGNOSIS — L570 Actinic keratosis: Secondary | ICD-10-CM | POA: Diagnosis not present

## 2018-06-22 DIAGNOSIS — Z23 Encounter for immunization: Secondary | ICD-10-CM | POA: Diagnosis not present

## 2018-06-22 DIAGNOSIS — H918X2 Other specified hearing loss, left ear: Secondary | ICD-10-CM | POA: Diagnosis not present

## 2018-06-22 DIAGNOSIS — C44311 Basal cell carcinoma of skin of nose: Secondary | ICD-10-CM | POA: Diagnosis not present

## 2018-06-22 DIAGNOSIS — X32XXXD Exposure to sunlight, subsequent encounter: Secondary | ICD-10-CM | POA: Diagnosis not present

## 2018-07-06 DIAGNOSIS — H6983 Other specified disorders of Eustachian tube, bilateral: Secondary | ICD-10-CM | POA: Diagnosis not present

## 2018-07-06 DIAGNOSIS — H9012 Conductive hearing loss, unilateral, left ear, with unrestricted hearing on the contralateral side: Secondary | ICD-10-CM | POA: Diagnosis not present

## 2018-07-06 DIAGNOSIS — Z9989 Dependence on other enabling machines and devices: Secondary | ICD-10-CM | POA: Diagnosis not present

## 2018-07-06 DIAGNOSIS — G4733 Obstructive sleep apnea (adult) (pediatric): Secondary | ICD-10-CM | POA: Diagnosis not present

## 2018-07-06 DIAGNOSIS — H6123 Impacted cerumen, bilateral: Secondary | ICD-10-CM | POA: Diagnosis not present

## 2018-08-16 DIAGNOSIS — G4733 Obstructive sleep apnea (adult) (pediatric): Secondary | ICD-10-CM | POA: Diagnosis not present

## 2018-09-16 DIAGNOSIS — G4733 Obstructive sleep apnea (adult) (pediatric): Secondary | ICD-10-CM | POA: Diagnosis not present

## 2018-10-16 DIAGNOSIS — G4733 Obstructive sleep apnea (adult) (pediatric): Secondary | ICD-10-CM | POA: Diagnosis not present

## 2018-11-14 DIAGNOSIS — G4733 Obstructive sleep apnea (adult) (pediatric): Secondary | ICD-10-CM | POA: Diagnosis not present

## 2018-11-14 DIAGNOSIS — Z79899 Other long term (current) drug therapy: Secondary | ICD-10-CM | POA: Diagnosis not present

## 2018-11-14 DIAGNOSIS — E7800 Pure hypercholesterolemia, unspecified: Secondary | ICD-10-CM | POA: Diagnosis not present

## 2018-11-14 DIAGNOSIS — K9089 Other intestinal malabsorption: Secondary | ICD-10-CM | POA: Diagnosis not present

## 2018-11-14 DIAGNOSIS — G629 Polyneuropathy, unspecified: Secondary | ICD-10-CM | POA: Diagnosis not present

## 2018-11-14 DIAGNOSIS — K219 Gastro-esophageal reflux disease without esophagitis: Secondary | ICD-10-CM | POA: Diagnosis not present

## 2018-11-14 DIAGNOSIS — Z0000 Encounter for general adult medical examination without abnormal findings: Secondary | ICD-10-CM | POA: Diagnosis not present

## 2018-11-14 DIAGNOSIS — Z125 Encounter for screening for malignant neoplasm of prostate: Secondary | ICD-10-CM | POA: Diagnosis not present

## 2018-11-14 DIAGNOSIS — Z23 Encounter for immunization: Secondary | ICD-10-CM | POA: Diagnosis not present

## 2018-11-16 DIAGNOSIS — G4733 Obstructive sleep apnea (adult) (pediatric): Secondary | ICD-10-CM | POA: Diagnosis not present

## 2018-12-28 DIAGNOSIS — M2041 Other hammer toe(s) (acquired), right foot: Secondary | ICD-10-CM | POA: Diagnosis not present

## 2018-12-28 DIAGNOSIS — M79671 Pain in right foot: Secondary | ICD-10-CM | POA: Diagnosis not present

## 2019-01-15 DIAGNOSIS — Z85828 Personal history of other malignant neoplasm of skin: Secondary | ICD-10-CM | POA: Diagnosis not present

## 2019-01-15 DIAGNOSIS — L570 Actinic keratosis: Secondary | ICD-10-CM | POA: Diagnosis not present

## 2019-01-15 DIAGNOSIS — L718 Other rosacea: Secondary | ICD-10-CM | POA: Diagnosis not present

## 2019-01-15 DIAGNOSIS — L814 Other melanin hyperpigmentation: Secondary | ICD-10-CM | POA: Diagnosis not present

## 2019-01-24 DIAGNOSIS — H0100B Unspecified blepharitis left eye, upper and lower eyelids: Secondary | ICD-10-CM | POA: Diagnosis not present

## 2019-01-24 DIAGNOSIS — H0100A Unspecified blepharitis right eye, upper and lower eyelids: Secondary | ICD-10-CM | POA: Diagnosis not present

## 2019-01-24 DIAGNOSIS — H0015 Chalazion left lower eyelid: Secondary | ICD-10-CM | POA: Diagnosis not present

## 2019-01-24 DIAGNOSIS — H0014 Chalazion left upper eyelid: Secondary | ICD-10-CM | POA: Diagnosis not present

## 2019-02-05 DIAGNOSIS — G4733 Obstructive sleep apnea (adult) (pediatric): Secondary | ICD-10-CM | POA: Diagnosis not present

## 2019-05-21 DIAGNOSIS — L718 Other rosacea: Secondary | ICD-10-CM | POA: Diagnosis not present

## 2019-05-21 DIAGNOSIS — L570 Actinic keratosis: Secondary | ICD-10-CM | POA: Diagnosis not present

## 2019-05-27 DIAGNOSIS — H0015 Chalazion left lower eyelid: Secondary | ICD-10-CM | POA: Diagnosis not present

## 2019-06-10 DIAGNOSIS — H25813 Combined forms of age-related cataract, bilateral: Secondary | ICD-10-CM | POA: Diagnosis not present

## 2019-06-10 DIAGNOSIS — H532 Diplopia: Secondary | ICD-10-CM | POA: Diagnosis not present

## 2019-06-10 DIAGNOSIS — H0015 Chalazion left lower eyelid: Secondary | ICD-10-CM | POA: Diagnosis not present

## 2019-06-10 DIAGNOSIS — H52203 Unspecified astigmatism, bilateral: Secondary | ICD-10-CM | POA: Diagnosis not present

## 2019-07-10 DIAGNOSIS — G4733 Obstructive sleep apnea (adult) (pediatric): Secondary | ICD-10-CM | POA: Diagnosis not present

## 2019-07-17 ENCOUNTER — Other Ambulatory Visit: Payer: Self-pay | Admitting: Geriatric Medicine

## 2019-07-17 DIAGNOSIS — R202 Paresthesia of skin: Secondary | ICD-10-CM | POA: Diagnosis not present

## 2019-07-17 DIAGNOSIS — G4452 New daily persistent headache (NDPH): Secondary | ICD-10-CM | POA: Diagnosis not present

## 2019-07-17 DIAGNOSIS — H532 Diplopia: Secondary | ICD-10-CM | POA: Diagnosis not present

## 2019-07-17 DIAGNOSIS — Z79899 Other long term (current) drug therapy: Secondary | ICD-10-CM | POA: Diagnosis not present

## 2019-07-17 DIAGNOSIS — G629 Polyneuropathy, unspecified: Secondary | ICD-10-CM | POA: Diagnosis not present

## 2019-07-17 DIAGNOSIS — M940 Chondrocostal junction syndrome [Tietze]: Secondary | ICD-10-CM | POA: Diagnosis not present

## 2019-07-17 DIAGNOSIS — R519 Headache, unspecified: Secondary | ICD-10-CM

## 2019-08-06 ENCOUNTER — Other Ambulatory Visit: Payer: Self-pay | Admitting: Geriatric Medicine

## 2019-08-06 ENCOUNTER — Other Ambulatory Visit: Payer: BLUE CROSS/BLUE SHIELD

## 2019-08-06 DIAGNOSIS — R519 Headache, unspecified: Secondary | ICD-10-CM

## 2019-08-12 ENCOUNTER — Ambulatory Visit
Admission: RE | Admit: 2019-08-12 | Discharge: 2019-08-12 | Disposition: A | Discharge status: Home / Self Care | Payer: BC Managed Care – PPO | Source: Ambulatory Visit | Attending: Geriatric Medicine | Admitting: Geriatric Medicine

## 2019-08-12 DIAGNOSIS — R519 Headache, unspecified: Secondary | ICD-10-CM

## 2019-08-12 MED ORDER — IOPAMIDOL (ISOVUE-300) INJECTION 61%
75.0000 mL | Freq: Once | INTRAVENOUS | Status: AC | PRN
  Administered 2019-08-12: 17:00:00 75 mL via INTRAVENOUS

## 2019-09-04 DIAGNOSIS — R519 Headache, unspecified: Secondary | ICD-10-CM | POA: Diagnosis not present

## 2019-09-04 DIAGNOSIS — G4733 Obstructive sleep apnea (adult) (pediatric): Secondary | ICD-10-CM | POA: Diagnosis not present

## 2019-09-04 DIAGNOSIS — H6983 Other specified disorders of Eustachian tube, bilateral: Secondary | ICD-10-CM | POA: Diagnosis not present

## 2019-09-04 DIAGNOSIS — H6123 Impacted cerumen, bilateral: Secondary | ICD-10-CM | POA: Diagnosis not present

## 2019-09-04 DIAGNOSIS — G8929 Other chronic pain: Secondary | ICD-10-CM | POA: Diagnosis not present

## 2019-10-03 DIAGNOSIS — S93601A Unspecified sprain of right foot, initial encounter: Secondary | ICD-10-CM | POA: Diagnosis not present

## 2019-10-24 DIAGNOSIS — S92314A Nondisplaced fracture of first metatarsal bone, right foot, initial encounter for closed fracture: Secondary | ICD-10-CM | POA: Diagnosis not present

## 2019-10-29 DIAGNOSIS — M79671 Pain in right foot: Secondary | ICD-10-CM | POA: Diagnosis not present

## 2019-11-07 DIAGNOSIS — H43811 Vitreous degeneration, right eye: Secondary | ICD-10-CM | POA: Diagnosis not present

## 2019-11-07 DIAGNOSIS — H53002 Unspecified amblyopia, left eye: Secondary | ICD-10-CM | POA: Diagnosis not present

## 2019-11-07 DIAGNOSIS — H5310 Unspecified subjective visual disturbances: Secondary | ICD-10-CM | POA: Diagnosis not present

## 2019-11-27 DIAGNOSIS — Z0000 Encounter for general adult medical examination without abnormal findings: Secondary | ICD-10-CM | POA: Diagnosis not present

## 2019-11-27 DIAGNOSIS — E7800 Pure hypercholesterolemia, unspecified: Secondary | ICD-10-CM | POA: Diagnosis not present

## 2019-11-27 DIAGNOSIS — Z23 Encounter for immunization: Secondary | ICD-10-CM | POA: Diagnosis not present

## 2019-11-27 DIAGNOSIS — Z125 Encounter for screening for malignant neoplasm of prostate: Secondary | ICD-10-CM | POA: Diagnosis not present

## 2019-11-27 DIAGNOSIS — K9049 Malabsorption due to intolerance, not elsewhere classified: Secondary | ICD-10-CM | POA: Diagnosis not present

## 2019-11-27 DIAGNOSIS — Z79899 Other long term (current) drug therapy: Secondary | ICD-10-CM | POA: Diagnosis not present

## 2019-12-09 DIAGNOSIS — H0015 Chalazion left lower eyelid: Secondary | ICD-10-CM | POA: Diagnosis not present

## 2019-12-09 DIAGNOSIS — H43811 Vitreous degeneration, right eye: Secondary | ICD-10-CM | POA: Diagnosis not present

## 2019-12-09 DIAGNOSIS — H02051 Trichiasis without entropian right upper eyelid: Secondary | ICD-10-CM | POA: Diagnosis not present

## 2019-12-09 DIAGNOSIS — H353121 Nonexudative age-related macular degeneration, left eye, early dry stage: Secondary | ICD-10-CM | POA: Diagnosis not present

## 2020-06-10 DIAGNOSIS — C4441 Basal cell carcinoma of skin of scalp and neck: Secondary | ICD-10-CM | POA: Diagnosis not present

## 2020-06-10 DIAGNOSIS — C44319 Basal cell carcinoma of skin of other parts of face: Secondary | ICD-10-CM | POA: Diagnosis not present

## 2020-06-10 DIAGNOSIS — L738 Other specified follicular disorders: Secondary | ICD-10-CM | POA: Diagnosis not present

## 2020-06-10 DIAGNOSIS — C44212 Basal cell carcinoma of skin of right ear and external auricular canal: Secondary | ICD-10-CM | POA: Diagnosis not present

## 2020-06-10 DIAGNOSIS — L814 Other melanin hyperpigmentation: Secondary | ICD-10-CM | POA: Diagnosis not present

## 2020-06-10 DIAGNOSIS — L570 Actinic keratosis: Secondary | ICD-10-CM | POA: Diagnosis not present

## 2020-06-10 DIAGNOSIS — Z85828 Personal history of other malignant neoplasm of skin: Secondary | ICD-10-CM | POA: Diagnosis not present

## 2020-06-10 DIAGNOSIS — L718 Other rosacea: Secondary | ICD-10-CM | POA: Diagnosis not present

## 2020-06-10 DIAGNOSIS — D485 Neoplasm of uncertain behavior of skin: Secondary | ICD-10-CM | POA: Diagnosis not present

## 2020-06-10 DIAGNOSIS — D045 Carcinoma in situ of skin of trunk: Secondary | ICD-10-CM | POA: Diagnosis not present

## 2020-06-24 DIAGNOSIS — C44212 Basal cell carcinoma of skin of right ear and external auricular canal: Secondary | ICD-10-CM | POA: Diagnosis not present

## 2020-06-24 DIAGNOSIS — Z85828 Personal history of other malignant neoplasm of skin: Secondary | ICD-10-CM | POA: Diagnosis not present

## 2020-06-26 DIAGNOSIS — H52203 Unspecified astigmatism, bilateral: Secondary | ICD-10-CM | POA: Diagnosis not present

## 2020-06-26 DIAGNOSIS — H0014 Chalazion left upper eyelid: Secondary | ICD-10-CM | POA: Diagnosis not present

## 2020-06-26 DIAGNOSIS — H0100A Unspecified blepharitis right eye, upper and lower eyelids: Secondary | ICD-10-CM | POA: Diagnosis not present

## 2020-06-26 DIAGNOSIS — H53002 Unspecified amblyopia, left eye: Secondary | ICD-10-CM | POA: Diagnosis not present

## 2020-07-08 DIAGNOSIS — C4441 Basal cell carcinoma of skin of scalp and neck: Secondary | ICD-10-CM | POA: Diagnosis not present

## 2020-07-08 DIAGNOSIS — D045 Carcinoma in situ of skin of trunk: Secondary | ICD-10-CM | POA: Diagnosis not present

## 2020-07-08 DIAGNOSIS — D04111 Carcinoma in situ of skin of right upper eyelid, including canthus: Secondary | ICD-10-CM | POA: Diagnosis not present

## 2020-07-08 DIAGNOSIS — Z85828 Personal history of other malignant neoplasm of skin: Secondary | ICD-10-CM | POA: Diagnosis not present

## 2020-07-08 DIAGNOSIS — D485 Neoplasm of uncertain behavior of skin: Secondary | ICD-10-CM | POA: Diagnosis not present

## 2020-12-02 DIAGNOSIS — E7800 Pure hypercholesterolemia, unspecified: Secondary | ICD-10-CM | POA: Diagnosis not present

## 2020-12-02 DIAGNOSIS — G629 Polyneuropathy, unspecified: Secondary | ICD-10-CM | POA: Diagnosis not present

## 2020-12-02 DIAGNOSIS — D369 Benign neoplasm, unspecified site: Secondary | ICD-10-CM | POA: Diagnosis not present

## 2020-12-02 DIAGNOSIS — G4733 Obstructive sleep apnea (adult) (pediatric): Secondary | ICD-10-CM | POA: Diagnosis not present

## 2020-12-02 DIAGNOSIS — K219 Gastro-esophageal reflux disease without esophagitis: Secondary | ICD-10-CM | POA: Diagnosis not present

## 2020-12-02 DIAGNOSIS — Z79899 Other long term (current) drug therapy: Secondary | ICD-10-CM | POA: Diagnosis not present

## 2020-12-02 DIAGNOSIS — K9089 Other intestinal malabsorption: Secondary | ICD-10-CM | POA: Diagnosis not present

## 2020-12-02 DIAGNOSIS — Z0000 Encounter for general adult medical examination without abnormal findings: Secondary | ICD-10-CM | POA: Diagnosis not present

## 2021-06-23 ENCOUNTER — Encounter: Payer: Self-pay | Admitting: Geriatric Medicine

## 2021-06-28 DIAGNOSIS — H2513 Age-related nuclear cataract, bilateral: Secondary | ICD-10-CM | POA: Diagnosis not present

## 2021-06-28 DIAGNOSIS — H0102A Squamous blepharitis right eye, upper and lower eyelids: Secondary | ICD-10-CM | POA: Diagnosis not present

## 2021-06-28 DIAGNOSIS — H53022 Refractive amblyopia, left eye: Secondary | ICD-10-CM | POA: Diagnosis not present

## 2021-06-28 DIAGNOSIS — H43811 Vitreous degeneration, right eye: Secondary | ICD-10-CM | POA: Diagnosis not present

## 2021-06-28 DIAGNOSIS — L719 Rosacea, unspecified: Secondary | ICD-10-CM | POA: Diagnosis not present

## 2021-06-28 DIAGNOSIS — H43392 Other vitreous opacities, left eye: Secondary | ICD-10-CM | POA: Diagnosis not present

## 2021-06-28 DIAGNOSIS — H0102B Squamous blepharitis left eye, upper and lower eyelids: Secondary | ICD-10-CM | POA: Diagnosis not present

## 2021-09-16 DIAGNOSIS — I4891 Unspecified atrial fibrillation: Secondary | ICD-10-CM | POA: Diagnosis not present

## 2021-10-07 DIAGNOSIS — I4891 Unspecified atrial fibrillation: Secondary | ICD-10-CM | POA: Diagnosis not present

## 2021-10-22 DIAGNOSIS — I4891 Unspecified atrial fibrillation: Secondary | ICD-10-CM | POA: Diagnosis not present

## 2021-11-05 DIAGNOSIS — Z09 Encounter for follow-up examination after completed treatment for conditions other than malignant neoplasm: Secondary | ICD-10-CM | POA: Diagnosis not present

## 2021-11-05 DIAGNOSIS — K648 Other hemorrhoids: Secondary | ICD-10-CM | POA: Diagnosis not present

## 2021-11-05 DIAGNOSIS — Z86010 Personal history of colonic polyps: Secondary | ICD-10-CM | POA: Diagnosis not present

## 2021-11-05 DIAGNOSIS — K5730 Diverticulosis of large intestine without perforation or abscess without bleeding: Secondary | ICD-10-CM | POA: Diagnosis not present

## 2021-12-08 DIAGNOSIS — K9089 Other intestinal malabsorption: Secondary | ICD-10-CM | POA: Diagnosis not present

## 2021-12-08 DIAGNOSIS — E782 Mixed hyperlipidemia: Secondary | ICD-10-CM | POA: Diagnosis not present

## 2021-12-08 DIAGNOSIS — H6121 Impacted cerumen, right ear: Secondary | ICD-10-CM | POA: Diagnosis not present

## 2021-12-08 DIAGNOSIS — G4733 Obstructive sleep apnea (adult) (pediatric): Secondary | ICD-10-CM | POA: Diagnosis not present

## 2021-12-08 DIAGNOSIS — K219 Gastro-esophageal reflux disease without esophagitis: Secondary | ICD-10-CM | POA: Diagnosis not present

## 2021-12-08 DIAGNOSIS — Z23 Encounter for immunization: Secondary | ICD-10-CM | POA: Diagnosis not present

## 2021-12-08 DIAGNOSIS — Z79899 Other long term (current) drug therapy: Secondary | ICD-10-CM | POA: Diagnosis not present

## 2021-12-08 DIAGNOSIS — Z125 Encounter for screening for malignant neoplasm of prostate: Secondary | ICD-10-CM | POA: Diagnosis not present

## 2021-12-08 DIAGNOSIS — Z0000 Encounter for general adult medical examination without abnormal findings: Secondary | ICD-10-CM | POA: Diagnosis not present

## 2021-12-14 DIAGNOSIS — H6121 Impacted cerumen, right ear: Secondary | ICD-10-CM | POA: Diagnosis not present

## 2022-01-24 DIAGNOSIS — L718 Other rosacea: Secondary | ICD-10-CM | POA: Diagnosis not present

## 2022-01-24 DIAGNOSIS — Z85828 Personal history of other malignant neoplasm of skin: Secondary | ICD-10-CM | POA: Diagnosis not present

## 2022-01-24 DIAGNOSIS — D485 Neoplasm of uncertain behavior of skin: Secondary | ICD-10-CM | POA: Diagnosis not present

## 2022-01-24 DIAGNOSIS — L570 Actinic keratosis: Secondary | ICD-10-CM | POA: Diagnosis not present

## 2022-01-24 DIAGNOSIS — C44319 Basal cell carcinoma of skin of other parts of face: Secondary | ICD-10-CM | POA: Diagnosis not present

## 2022-01-24 DIAGNOSIS — L738 Other specified follicular disorders: Secondary | ICD-10-CM | POA: Diagnosis not present

## 2022-01-24 DIAGNOSIS — L821 Other seborrheic keratosis: Secondary | ICD-10-CM | POA: Diagnosis not present

## 2022-01-24 DIAGNOSIS — L814 Other melanin hyperpigmentation: Secondary | ICD-10-CM | POA: Diagnosis not present

## 2022-03-10 DIAGNOSIS — C44319 Basal cell carcinoma of skin of other parts of face: Secondary | ICD-10-CM | POA: Diagnosis not present

## 2022-03-10 DIAGNOSIS — Z85828 Personal history of other malignant neoplasm of skin: Secondary | ICD-10-CM | POA: Diagnosis not present

## 2022-07-05 DIAGNOSIS — H5713 Ocular pain, bilateral: Secondary | ICD-10-CM | POA: Diagnosis not present

## 2022-07-05 DIAGNOSIS — H2513 Age-related nuclear cataract, bilateral: Secondary | ICD-10-CM | POA: Diagnosis not present

## 2022-07-05 DIAGNOSIS — H0102A Squamous blepharitis right eye, upper and lower eyelids: Secondary | ICD-10-CM | POA: Diagnosis not present

## 2022-07-05 DIAGNOSIS — L719 Rosacea, unspecified: Secondary | ICD-10-CM | POA: Diagnosis not present

## 2022-07-05 DIAGNOSIS — H0102B Squamous blepharitis left eye, upper and lower eyelids: Secondary | ICD-10-CM | POA: Diagnosis not present

## 2022-07-05 DIAGNOSIS — H43392 Other vitreous opacities, left eye: Secondary | ICD-10-CM | POA: Diagnosis not present

## 2022-07-05 DIAGNOSIS — H43811 Vitreous degeneration, right eye: Secondary | ICD-10-CM | POA: Diagnosis not present

## 2022-12-20 ENCOUNTER — Other Ambulatory Visit (HOSPITAL_COMMUNITY): Payer: Self-pay | Admitting: Internal Medicine

## 2022-12-20 DIAGNOSIS — F5101 Primary insomnia: Secondary | ICD-10-CM | POA: Diagnosis not present

## 2022-12-20 DIAGNOSIS — I499 Cardiac arrhythmia, unspecified: Secondary | ICD-10-CM | POA: Diagnosis not present

## 2022-12-20 DIAGNOSIS — G4733 Obstructive sleep apnea (adult) (pediatric): Secondary | ICD-10-CM | POA: Diagnosis not present

## 2022-12-20 DIAGNOSIS — K219 Gastro-esophageal reflux disease without esophagitis: Secondary | ICD-10-CM | POA: Diagnosis not present

## 2022-12-20 DIAGNOSIS — Z1211 Encounter for screening for malignant neoplasm of colon: Secondary | ICD-10-CM | POA: Diagnosis not present

## 2022-12-20 DIAGNOSIS — N529 Male erectile dysfunction, unspecified: Secondary | ICD-10-CM | POA: Diagnosis not present

## 2022-12-20 DIAGNOSIS — E782 Mixed hyperlipidemia: Secondary | ICD-10-CM

## 2022-12-20 DIAGNOSIS — K9089 Other intestinal malabsorption: Secondary | ICD-10-CM | POA: Diagnosis not present

## 2022-12-20 DIAGNOSIS — Z9989 Dependence on other enabling machines and devices: Secondary | ICD-10-CM | POA: Diagnosis not present

## 2022-12-20 DIAGNOSIS — Z79899 Other long term (current) drug therapy: Secondary | ICD-10-CM | POA: Diagnosis not present

## 2022-12-20 DIAGNOSIS — Z125 Encounter for screening for malignant neoplasm of prostate: Secondary | ICD-10-CM | POA: Diagnosis not present

## 2022-12-20 DIAGNOSIS — Z0000 Encounter for general adult medical examination without abnormal findings: Secondary | ICD-10-CM | POA: Diagnosis not present

## 2023-01-26 DIAGNOSIS — L814 Other melanin hyperpigmentation: Secondary | ICD-10-CM | POA: Diagnosis not present

## 2023-01-26 DIAGNOSIS — L905 Scar conditions and fibrosis of skin: Secondary | ICD-10-CM | POA: Diagnosis not present

## 2023-01-26 DIAGNOSIS — L570 Actinic keratosis: Secondary | ICD-10-CM | POA: Diagnosis not present

## 2023-01-26 DIAGNOSIS — Z85828 Personal history of other malignant neoplasm of skin: Secondary | ICD-10-CM | POA: Diagnosis not present

## 2023-01-26 DIAGNOSIS — L218 Other seborrheic dermatitis: Secondary | ICD-10-CM | POA: Diagnosis not present

## 2023-01-27 ENCOUNTER — Ambulatory Visit (HOSPITAL_BASED_OUTPATIENT_CLINIC_OR_DEPARTMENT_OTHER)
Admission: RE | Admit: 2023-01-27 | Discharge: 2023-01-27 | Disposition: A | Discharge status: Home / Self Care | Payer: BC Managed Care – PPO | Source: Ambulatory Visit | Attending: Internal Medicine | Admitting: Internal Medicine

## 2023-01-27 DIAGNOSIS — E782 Mixed hyperlipidemia: Secondary | ICD-10-CM | POA: Insufficient documentation

## 2023-02-06 ENCOUNTER — Telehealth: Payer: Self-pay | Admitting: Emergency Medicine

## 2023-02-06 NOTE — Telephone Encounter (Signed)
Pt returned call- scheduled an appt with Dr Royann Shivers. 02/16/23 at 11:00.

## 2023-02-06 NOTE — Telephone Encounter (Signed)
No answer, left message and call back number. Called patient to get him scheduled for an appt on 02/16/23- the day just came "open- it is a half day"  Pt needs to be seen due to elevated Calcium score.   Scheduled the patient for 02/16/23 at 0900

## 2023-02-09 ENCOUNTER — Other Ambulatory Visit: Payer: Self-pay

## 2023-02-09 ENCOUNTER — Emergency Department (HOSPITAL_BASED_OUTPATIENT_CLINIC_OR_DEPARTMENT_OTHER)
Admission: EM | Admit: 2023-02-09 | Discharge: 2023-02-09 | Disposition: A | Discharge status: Home / Self Care | Payer: PPO | Attending: Emergency Medicine | Admitting: Emergency Medicine

## 2023-02-09 ENCOUNTER — Encounter (HOSPITAL_BASED_OUTPATIENT_CLINIC_OR_DEPARTMENT_OTHER): Payer: Self-pay

## 2023-02-09 ENCOUNTER — Emergency Department (HOSPITAL_BASED_OUTPATIENT_CLINIC_OR_DEPARTMENT_OTHER): Payer: PPO | Admitting: Radiology

## 2023-02-09 DIAGNOSIS — Z7982 Long term (current) use of aspirin: Secondary | ICD-10-CM | POA: Insufficient documentation

## 2023-02-09 DIAGNOSIS — Z85828 Personal history of other malignant neoplasm of skin: Secondary | ICD-10-CM | POA: Diagnosis not present

## 2023-02-09 DIAGNOSIS — R0789 Other chest pain: Secondary | ICD-10-CM | POA: Insufficient documentation

## 2023-02-09 DIAGNOSIS — R079 Chest pain, unspecified: Secondary | ICD-10-CM | POA: Diagnosis not present

## 2023-02-09 LAB — BASIC METABOLIC PANEL
Anion gap: 14 (ref 5–15)
BUN: 25 mg/dL — ABNORMAL HIGH (ref 8–23)
CO2: 23 mmol/L (ref 22–32)
Calcium: 8.8 mg/dL — ABNORMAL LOW (ref 8.9–10.3)
Chloride: 103 mmol/L (ref 98–111)
Creatinine, Ser: 1.21 mg/dL (ref 0.61–1.24)
GFR, Estimated: >60 mL/min (ref >60)
Glucose, Bld: 98 mg/dL (ref 70–99)
Potassium: 4 mmol/L (ref 3.5–5.1)
Sodium: 140 mmol/L (ref 135–145)

## 2023-02-09 LAB — CBC
HCT: 45 % (ref 39.0–52.0)
Hemoglobin: 15.5 g/dL (ref 13.0–17.0)
MCH: 30.6 pg (ref 26.0–34.0)
MCHC: 34.4 g/dL (ref 30.0–36.0)
MCV: 88.8 fL (ref 80.0–100.0)
Platelets: 170 10*3/uL (ref 150–400)
RBC: 5.07 MIL/uL (ref 4.22–5.81)
RDW: 13.9 % (ref 11.5–15.5)
WBC: 5.7 10*3/uL (ref 4.0–10.5)
nRBC: 0 % (ref 0.0–0.2)

## 2023-02-09 LAB — TROPONIN I (HIGH SENSITIVITY): Troponin I (High Sensitivity): 4 ng/L (ref <18)

## 2023-02-09 NOTE — ED Provider Notes (Signed)
[place] EMERGENCY DEPARTMENT AT [facility] Provider Note  CSN: [id number] Arrival date & time: 02/09/23 1853  Chief Complaint(s) Chest Pain  HPI Andrew [name] is a 67 y.o. male history of peripheral neuropathy presenting to the emergency department with chest pain.  Patient reports that he has had chronic chest pain described as a costochondritis.  He reports that his kind of tingling or jolting sensation.  Occurs intermittently.  Has been increasing in frequency.  Has been present for some time.  Triage note mentions shortness of breath and lightheadedness but to me the patient denies any symptoms.  He recently had a coronary CTA which did show an elevated coronary calcium score and he has an appoint with cardiology next week, since he is having some increasing symptoms he was worried this could be related and presented to the emergency department.  Denies any specific change in his symptoms today that prompted this ER visit.  No nausea, vomiting, syncope, diaphoresis.  Pain is not exertional and is not pleuritic.  No recent prolonged immobilization or history of DVT/PE.   Past Medical History Past Medical History:  Diagnosis Date   Cancer [facility])    skin cancer on nose 2017   Difficult intubation    Needs to use pediatric intubation due to TMJ surgery   GERD (gastroesophageal reflux disease)    History of hiatal hernia    Nocturnal leg cramps 04/04/2017   Peripheral neuropathy 12/05/2016   Sleep apnea    Patient Active Problem List   Diagnosis Date Noted   Nocturnal leg cramps 04/04/2017   Peripheral neuropathy 12/05/2016   Home Medication(s) Prior to Admission medications   Medication Sig Start Date End Date Taking? Authorizing Provider  aspirin 81 MG tablet Take 81 mg by mouth daily.    [provider]  Cholecalciferol (VITAMIN D3) 1000 units CAPS Take 3,000 Units by mouth daily.    [provider]  Coenzyme Q10 (COQ10) 100 MG CAPS Take 1 capsule by  mouth daily.    [provider]  fluticasone (FLONASE) 50 MCG/ACT nasal spray Place 2 sprays into both nostrils daily as needed for allergies or rhinitis.    [provider]  gabapentin (NEURONTIN) 300 MG capsule Take 1 capsule (300 mg total) 2 (two) times daily by mouth. 04/04/17   York Spaniel, MD  gabapentin (NEURONTIN) 600 MG tablet TAKE 1 TABLET (600 MG TOTAL) 3 (THREE) TIMES DAILY BY MOUTH. 01/22/18   York Spaniel, MD  Magnesium 250 MG TABS Take 1 tablet by mouth daily.    [provider]  Multiple Vitamins-Minerals (MULTIVITAMIN WITH MINERALS) tablet Take 1 tablet by mouth daily.    [provider]  Omega-3 Fatty Acids (FISH OIL PO) Take 1,000 mg by mouth 2 (two) times daily.    [provider]  omeprazole (PRILOSEC) 40 MG capsule Take 40 mg by mouth daily.    [provider]  Selenium 200 MCG TABS Take 1 tablet by mouth daily.    [provider]  simvastatin (ZOCOR) 80 MG tablet Take 80 mg by mouth daily.    [provider]  vitamin C (ASCORBIC ACID) 500 MG tablet Take 500-1,000 mg by mouth daily.    [provider]  zinc gluconate 50 MG tablet Take 50 mg by mouth daily.    [provider]  zolpidem (AMBIEN CR) 6.25 MG CR tablet Take 6.25 mg by mouth at bedtime as needed for sleep.    [provider]                                                                                                                                    Past Surgical History Past Surgical History:  Procedure Laterality Date   COLONOSCOPY WITH PROPOFOL N/A 05/16/2016   Procedure: COLONOSCOPY WITH PROPOFOL;  Surgeon: Charolett Bumpers, MD;  Location: [facility] ENDOSCOPY;  Service: Endoscopy;  Laterality: N/A;   HIATAL HERNIA REPAIR     14 years ago   LUMBAR DISC SURGERY  2014   L4   total TMJ replacement Bilateral    15-18 years ago   Family History Family History  Problem Relation Age of Onset   Anuerysm  Mother     Social History Social History   Tobacco Use   Smoking status: Never   Smokeless tobacco: Never  Vaping Use   Vaping status: Never Used  Substance Use Topics   Alcohol use: Yes    Comment: 2 beers or mixed drinks a week   Drug use: No   Allergies Other and Erythromycin  Review of Systems Review of Systems  All other systems reviewed and are negative.   Physical Exam Vital Signs  I have reviewed the triage vital signs BP (!) 147/96 (BP Location: Right Arm)   Pulse 77   Temp 98.2 F (36.8 C) (Oral)   Resp 16   Ht 6\' 2"  (1.88 m)   Wt 86.6 kg   SpO2 99%   BMI 24.52 kg/m  Physical Exam Vitals and nursing note reviewed.  Constitutional:      General: He is not in acute distress.    Appearance: Normal appearance.  HENT:     Mouth/Throat:     Mouth: Mucous membranes are moist.  Eyes:     Conjunctiva/sclera: Conjunctivae normal.  Cardiovascular:     Rate and Rhythm: Normal rate and regular rhythm.  Pulmonary:     Effort: Pulmonary effort is normal. No respiratory distress.     Breath sounds: Normal breath sounds.  Abdominal:     General: Abdomen is flat.     Palpations: Abdomen is soft.     Tenderness: There is no abdominal tenderness.  Musculoskeletal:     Right lower leg: No edema.     Left lower leg: No edema.  Skin:    General: Skin is warm and dry.     Capillary Refill: Capillary refill takes less than 2 seconds.  Neurological:     Mental Status: He is alert and oriented to person, place, and time. Mental status is at baseline.  Psychiatric:        Mood and Affect: Mood normal.        Behavior: Behavior normal.     ED Results and Treatments Labs (all labs ordered are listed, but only abnormal results are displayed) Labs Reviewed  BASIC METABOLIC PANEL - Abnormal; Notable for the following components:  Result Value   BUN 25 (*)    Calcium 8.8 (*)    All other components within normal limits  CBC  TROPONIN I (HIGH SENSITIVITY)                                                                                                                           Radiology No results found.  Pertinent labs & imaging results that were available during my care of the patient were reviewed by me and considered in my medical decision making (see MDM for details).  Medications Ordered in ED Medications - No data to display                                                                                                                                   Procedures Procedures  (including critical care time)  Medical Decision Making / ED Course   MDM:  67 year old presenting to the emergency department with chest pain.  He reports his pain is similar to his chronic chest wall pain.  He presents primarily because it is increased in frequency and he had a recent positive coronary score and he is concerned this could be related.  EKG is reassuring, appears unchanged from EKG from 2008.  His troponin is negative.  Overall very low concern for ACS.  Reviewed his coronary calcium score which is elevated, and patient appropriately has follow-up with cardiology next week.  Low concern that this represents cardiac chest pain.  Also low concern for other acute process, chest x-ray on my interpretation negative for any acute process.  I will notify patient of any positive result on radiology read.  Doubt other acute process such as pneumothorax, dissection, pulmonary embolism, esophageal perforation, or other acute issue.  Discussed strict return precautions. Will discharge patient to home. All questions answered. Patient comfortable with plan of discharge. Return precautions discussed with patient and specified on the after visit summary.       Additional history obtained: -Additional history obtained from spouse -External records from outside source obtained and reviewed including: Chart review including previous notes, labs, imaging, consultation  notes including coronary calcium score   Lab Tests: -I ordered, reviewed, and interpreted labs.   The pertinent results include:   Labs Reviewed  BASIC METABOLIC PANEL - Abnormal; Notable for the following components:      Result Value   BUN 25 (*)  Calcium 8.8 (*)    All other components within normal limits  CBC  TROPONIN I (HIGH SENSITIVITY)    Notable for nonspecific mild BUN elevation and minimal hypocalcemia. Normal troponin/cbc  EKG   EKG Interpretation Date/Time:  Thursday February 09 2023 19:06:14 EDT Ventricular Rate:  67 PR Interval:  170 QRS Duration:  102 QT Interval:  392 QTC Calculation: 414 R Axis:   71  Text Interpretation: Normal sinus rhythm Anterior infarct , age undetermined Abnormal ECG When compared with ECG of 13-Jul-2006 17:18, No significant change was found Confirmed by Alvino Blood ([id number]) on 02/09/2023 8:05:44 PM         Imaging Studies ordered: I ordered imaging studies including CXR On my interpretation imaging demonstrates no acute process I independently visualized and interpreted imaging. I agree with the radiologist interpretation   Medicines ordered and prescription drug management: No orders of the defined types were placed in this encounter.   -I have reviewed the patients home medicines and have made adjustments as needed    Cardiac Monitoring: The patient was maintained on a cardiac monitor.  I personally viewed and interpreted the cardiac monitored which showed an underlying rhythm of: NSR  Reevaluation: After the interventions noted above, I reevaluated the patient and found that their symptoms have improved  Co morbidities that complicate the patient evaluation  Past Medical History:  Diagnosis Date   Cancer ([facility])    skin cancer on nose 2017   Difficult intubation    Needs to use pediatric intubation due to TMJ surgery   GERD (gastroesophageal reflux disease)    History of hiatal hernia    Nocturnal leg  cramps 04/04/2017   Peripheral neuropathy 12/05/2016   Sleep apnea       Dispostion: Disposition decision including need for hospitalization was considered, and patient discharged from emergency department.    Final Clinical Impression(s) / ED Diagnoses Final diagnoses:  Atypical chest pain     This chart was dictated using voice recognition software.  Despite best efforts to proofread,  errors can occur which can change the documentation meaning.    Lonell Grandchild, MD 02/09/23 2043

## 2023-02-09 NOTE — ED Notes (Signed)
 RN reviewed discharge instructions with pt. Pt verbalized understanding and had no further questions. VSS upon discharge.  

## 2023-02-09 NOTE — ED Triage Notes (Addendum)
Pt with a hx of chronic costochondritis has had increasing CP over the last month. Pt reports associated lightheadedness and ShOB. Pain is intermittent, located bilaterally, and described as sharp, cramping, lightning like. Pain occasionally radiates into neck and back. Pt states he recently had a high calcium score and has been referred to cardiology.

## 2023-02-09 NOTE — Discharge Instructions (Signed)
We evaluated you for your chest pain.  Your testing in the emergency department is reassuring.  We did not see any signs of a dangerous problem as a cause of your symptoms.  Your cardiac enzymes were negative.  I would recommend following up closely with your cardiologist as scheduled next week.  You do have any new or worsening symptoms such as severe pain, lightheadedness or dizziness, difficulty breathing, nausea or vomiting, fainting, sweating, or back pain, please come back to the emergency department for repeat evaluation.

## 2023-02-13 DIAGNOSIS — I499 Cardiac arrhythmia, unspecified: Secondary | ICD-10-CM | POA: Diagnosis not present

## 2023-02-16 ENCOUNTER — Ambulatory Visit: Payer: BC Managed Care – PPO | Admitting: Cardiovascular Disease

## 2023-02-16 ENCOUNTER — Encounter: Payer: Self-pay | Admitting: Cardiovascular Disease

## 2023-02-16 ENCOUNTER — Ambulatory Visit: Payer: BC Managed Care – PPO | Attending: Cardiovascular Disease | Admitting: Cardiovascular Disease

## 2023-02-16 VITALS — BP 128/82 | HR 68 | Ht 74.0 in | Wt 197.4 lb

## 2023-02-16 DIAGNOSIS — R931 Abnormal findings on diagnostic imaging of heart and coronary circulation: Secondary | ICD-10-CM

## 2023-02-16 DIAGNOSIS — I480 Paroxysmal atrial fibrillation: Secondary | ICD-10-CM | POA: Diagnosis not present

## 2023-02-16 DIAGNOSIS — E785 Hyperlipidemia, unspecified: Secondary | ICD-10-CM

## 2023-02-16 DIAGNOSIS — R079 Chest pain, unspecified: Secondary | ICD-10-CM

## 2023-02-16 DIAGNOSIS — R072 Precordial pain: Secondary | ICD-10-CM

## 2023-02-16 DIAGNOSIS — I25118 Atherosclerotic heart disease of native coronary artery with other forms of angina pectoris: Secondary | ICD-10-CM

## 2023-02-16 MED ORDER — METOPROLOL SUCCINATE ER 25 MG PO TB24: 25.0000 mg | ORAL_TABLET | Freq: Every day | ORAL | 3 refills | Status: DC

## 2023-02-16 MED ORDER — APIXABAN 5 MG PO TABS: 5.0000 mg | ORAL_TABLET | Freq: Two times a day (BID) | ORAL | 11 refills | Status: DC

## 2023-02-16 MED ORDER — EZETIMIBE 10 MG PO TABS: 10.0000 mg | ORAL_TABLET | Freq: Every day | ORAL | 3 refills | Status: DC

## 2023-02-16 NOTE — Patient Instructions (Signed)
Medication Instructions:  STOP: ASPIRIN START: ZETIA 10 mg EVERY MORNING METOPROLOL SUCCINATE 25 MG DAILY ELIQUIS 5 MG TWICE A DAY *If you need a refill on your cardiac medications before your next appointment, please call your pharmacy*   Lab Work: LIPID PANEL- Please return for Blood Work in 3 MONTHS. No appointment needed, lab here at the office is open Monday-Friday from 8AM to 4PM and closed daily for lunch from 12:45-1:45.   If you have labs (blood work) drawn today and your tests are completely normal, you will receive your results only by: MyChart Message (if you have MyChart) OR A paper copy in the mail If you have any lab test that is abnormal or we need to change your treatment, we will call you to review the results.   Testing/Procedures: Your physician has requested that you have an echocardiogram. Echocardiography is a painless test that uses sound waves to create images of your heart. It provides your doctor with information about the size and shape of your heart and how well your heart's chambers and valves are working. This procedure takes approximately one hour. There are no restrictions for this procedure. Please do NOT wear cologne, perfume, aftershave, or lotions (deodorant is allowed). Please arrive 15 minutes prior to your appointment time.   How to Prepare for Your Cardiac PET/CT Stress Test:  1.  Take all regularly scheduled medications    2. Nothing to eat or drink, except water, 3 hours prior to arrival time.   NO caffeine/decaffeinated products, or chocolate 12 hours prior to arrival.  3. NO perfume, cologne or lotion on chest or abdomen area.            4. Total time is 1 to 2 hours; you may want to bring reading material for the waiting time.  5. Please report to Radiology at the [facility] Main Entrance 30 minutes early for your test.  [address] [place], [place] [address]  In preparation for your appointment, medication and  supplies will be purchased.  Appointment availability is limited, so if you need to cancel or reschedule, please call the Radiology Department at [phone] [facility]) OR [phone] [facility])  24 hours in advance to avoid a cancellation fee of $100.00  What to Expect After you Arrive:  Once you arrive and check in for your appointment, you will be taken to a preparation room within the Radiology Department.  A technologist or Nurse will obtain your medical history, verify that you are correctly prepped for the exam, and explain the procedure.  Afterwards,  an IV will be started in your arm and electrodes will be placed on your skin for EKG monitoring during the stress portion of the exam. Then you will be escorted to the PET/CT scanner.  There, staff will get you positioned on the scanner and obtain a blood pressure and EKG.  During the exam, you will continue to be connected to the EKG and blood pressure machines.  A small, safe amount of a radioactive tracer will be injected in your IV to obtain a series of pictures of your heart along with an injection of a stress agent.    After your Exam:  It is recommended that you eat a meal and drink a caffeinated beverage to counter act any effects of the stress agent.  Drink plenty of fluids for the remainder of the day and urinate frequently for the first couple of hours after the exam.  Your doctor will  inform you of your test results within 7-10 business days.  For more information and frequently asked questions, please visit our website : [email or web address]  For questions about your test or how to prepare for your test, please call: Cardiac Imaging Nurse Navigators Office: [phone]    Follow-Up: At [facility], you and your health needs are our priority.  As part of our continuing mission to provide you with exceptional heart care, we have created designated Provider Care Teams.  These Care Teams include your primary  Cardiologist (physician) and Advanced Practice Providers (APPs -  Physician Assistants and Nurse Practitioners) who all work together to provide you with the care you need, when you need it.  We recommend signing up for the patient portal called "MyChart".  Sign up information is provided on this After Visit Summary.  MyChart is used to connect with patients for Virtual Visits (Telemedicine).  Patients are able to view lab/test results, encounter notes, upcoming appointments, etc.  Non-urgent messages can be sent to your provider as well.   To learn more about what you can do with MyChart, go to [email or web address].    Your next appointment:   4 month(s)  Provider:   Dr Royann Shivers

## 2023-02-16 NOTE — H&P (View-Only) (Signed)
Cardiology Office Note:    Date:  02/16/2023   ID:  Andrew [name], DOB 05-16-1956, MRN [id number]  PCP:  Emilio Aspen, MD   [facility] Providers Cardiologist:  None     Referring MD: Emilio Aspen, *   No chief complaint on file. Andrew [name] is a 67 y.o. male who is being seen today for the evaluation of paroxysmal atrial fibrillation and elevated coronary calcium score at the request of Emilio Aspen, *.   History of Present Illness:    Andrew [name] is a 67 y.o. male with a hx of dyslipidemia who was recently had problems with chest discomfort and palpitations.  He is "always on the go" and denies limitations related to dyspnea or angina with exertion.  On the other hand he has a frequent sensation of chest tenderness that is present both at rest or with activity and could occur without obvious provocation.  He does not have orthopnea, PND, lower extremity edema or daytime hypersomnolence to suggest sleep apnea.  He has palpitations which he described as a sudden "adrenaline rush".  Describes shooting pain in his back that sounds neuralgic.  He has not experienced dizziness or syncope.  He does not have a history of stroke, TIA or other embolic events.  He had a recent coronary calcium score that was markedly elevated at 1920, placing him in the 95th percentile.  There is also atheromatous calcification of the aorta.  He wore a 2-week event monitor that shows a high burden of paroxysmal atrial fibrillation at 9%.  As a general rule, this is more common during daytime hours, presumed during activity and is much less frequent at night.  Some of the episodes are brief bursts of ectopic atrial tachycardia, with a pattern suggesting pulmonary vein tachycardia.  The longest episode is only 1 hour and 43 minutes in duration.  As a rule, he has rapid ventricular rates (150s-190s), but sometimes at night the ventricular rate is well-controlled.  He has frequent  premature supraventricular contractions cumulatively representing about 15 of all recorded beats and he also has occasional PVCs (around 1.2 %, no episodes of VT).  He has a history of sleep apnea treated with CPAP, followed by Dr. Annalee Genta in the ENT clinic.  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  He has a remote history of hiatal hernia repair.  Has been taking lipid-lowering drugs for about 30 years now.  His most recent LDL cholesterol was 91, HDL 37, normal triglycerides on simvastatin 80 mg daily.  He was switched to atorvastatin 40 mg daily.  Past Medical History:  Diagnosis Date   Cancer [facility])    skin cancer on nose 2017   Difficult intubation    Needs to use pediatric intubation due to TMJ surgery   GERD (gastroesophageal reflux disease)    History of hiatal hernia    Nocturnal leg cramps 04/04/2017   Peripheral neuropathy 12/05/2016   Sleep apnea     Past Surgical History:  Procedure Laterality Date   COLONOSCOPY WITH PROPOFOL N/A 05/16/2016   Procedure: COLONOSCOPY WITH PROPOFOL;  Surgeon: Charolett Bumpers, MD;  Location: [facility] ENDOSCOPY;  Service: Endoscopy;  Laterality: N/A;   HIATAL HERNIA REPAIR     14 years ago   LUMBAR DISC SURGERY  2014   L4   total TMJ replacement Bilateral    15-18 years ago  Current Medications: Current Meds  Medication Sig   apixaban (ELIQUIS) 5 MG TABS tablet Take 1 tablet (5 mg total) by mouth 2 (two) times daily.   ascorbic acid (VITAMIN C) 1000 MG tablet Take 1,000 mg by mouth 2 (two) times daily.   atorvastatin (LIPITOR) 40 MG tablet Take 40 mg by mouth daily.   B Complex-Folic Acid (B COMPLEX VITAMINS, W/ FA,) CAPS Take 1 capsule by mouth daily.   cholecalciferol (VITAMIN D3) 25 MCG (1000 UNIT) tablet Take 1,000 Units by mouth daily.   Coenzyme Q10 (COQ10) 100 MG CAPS Take 1 capsule by mouth in the morning and at bedtime.   Cyanocobalamin  (B-12) 2500 MCG TABS Take 2,500 mcg by mouth daily.   doxycycline (ADOXA) 100 MG tablet Take 100 mg by mouth every other day.   ezetimibe (ZETIA) 10 MG tablet Take 1 tablet (10 mg total) by mouth daily.   fluticasone (FLONASE) 50 MCG/ACT nasal spray Place 2 sprays into both nostrils daily as needed for allergies or rhinitis.   ibuprofen (ADVIL) 100 MG tablet Take 200 mg by mouth in the morning and at bedtime.   loratadine-pseudoephedrine (CLARITIN-D 12 HOUR) 5-120 MG tablet Take 1 tablet by mouth daily.   Magnesium 250 MG TABS Take 1 tablet by mouth daily.   metoprolol succinate (TOPROL XL) 25 MG 24 hr tablet Take 1 tablet (25 mg total) by mouth daily.   Multiple Vitamins-Minerals (MULTIVITAMIN WITH MINERALS) tablet Take 1 tablet by mouth daily.   Multiple Vitamins-Minerals (PRESERVISION AREDS 2+MULTI VIT PO) Take 1 tablet by mouth in the morning and at bedtime.   Omega-3 Fatty Acids (FISH OIL PO) Take 1,000 mg by mouth 2 (two) times daily.   omeprazole (PRILOSEC) 40 MG capsule Take 40 mg by mouth daily.   Selenium 200 MCG TABS Take 1 tablet by mouth daily.   tretinoin (RETIN-A) 0.1 % cream Apply 0.1 % topically as needed.   vitamin C (ASCORBIC ACID) 500 MG tablet Take 500-1,000 mg by mouth daily.   [DISCONTINUED] aspirin 81 MG tablet Take 81 mg by mouth daily.   [DISCONTINUED] Cholecalciferol (VITAMIN D3) 1000 units CAPS Take 3,000 Units by mouth daily.   [DISCONTINUED] zolpidem (AMBIEN CR) 6.25 MG CR tablet Take 6.25 mg by mouth at bedtime as needed.     Allergies:   Other and Erythromycin   Social History   Socioeconomic History   Marital status: Single    Spouse name: Not on file   Number of children: 0   Years of education: 2 years college   Highest education level: Not on file  Occupational History   Occupation: Surveyor, minerals  Tobacco Use   Smoking status: Never   Smokeless tobacco: Never  Vaping Use   Vaping status: Never Used  Substance and Sexual Activity   Alcohol use:  Yes    Comment: 2 beers or mixed drinks a week   Drug use: No   Sexual activity: Not on file  Other Topics Concern   Not on file  Social History Narrative   Lives at home with his girlfriend.   Right-handed.   3 cups caffeine daily.   Social Determinants of Health   Financial Resource Strain: Not on file  Food Insecurity: Not on file  Transportation Needs: Not on file  Physical Activity: Not on file  Stress: Not on file  Social Connections: Not on file     Family History: The patient's family history includes cerebral aneurysm in his mother, who died in  her 60s.  Multiple family members had heart problems, but not at young ages.  ROS:   Please see the history of present illness.     All other systems reviewed and are negative.  EKGs/Labs/Other Studies Reviewed:    The following studies were reviewed today:  Event monitor described above with roughly 9% burden of atrial fibrillation with rapid ventricular response, 50% burden of premature atrial beats and occasional PVCs.  EKG Interpretation Date/Time:  Thursday February 16 2023 11:14:16 EDT Ventricular Rate:  68 PR Interval:  184 QRS Duration:  104 QT Interval:  392 QTC Calculation: 416 R Axis:   45  Text Interpretation: Sinus rhythm with occasional Premature ventricular complexes When compared with ECG of 09-Feb-2023 19:06, Premature ventricular complexes are now Present Confirmed by Maila Dukes [phone]) on 02/16/2023 11:30:18 AM    Recent Labs: 02/09/2023: BUN 25; Creatinine, Ser 1.21; Hemoglobin 15.5; Platelets 170; Potassium 4.0; Sodium 140  Recent Lipid Panel No results found for: "CHOL", "TRIG", "HDL", "CHOLHDL", "VLDL", "LDLCALC", "LDLDIRECT" 12/20/2022 cholesterol 141, HDL 37, LDL 91, triglycerides 69 TSH 2.010   Risk Assessment/Calculations:    CHA2DS2-VASc Score = 2   This indicates a 2.2% annual risk of stroke. The patient's score is based upon: CHF History: 0 HTN History: 0 Diabetes History:  0 Stroke History: 0 Vascular Disease History: 1 Age Score: 1 Gender Score: 0               Physical Exam:    VS:  BP 128/82 (BP Location: Left Arm, Patient Position: Sitting, Cuff Size: Normal)   Pulse 68   Ht 6\' 2"  (1.88 m)   Wt 197 lb 6.4 oz (89.5 kg)   SpO2 97%   BMI 25.34 kg/m     Wt Readings from Last 3 Encounters:  02/16/23 197 lb 6.4 oz (89.5 kg)  02/09/23 191 lb (86.6 kg)  04/04/17 224 lb (101.6 kg)     GEN: Appears fit and younger than stated age, well nourished, well developed in no acute distress HEENT: Normal NECK: No JVD; No carotid bruits LYMPHATICS: No lymphadenopathy CARDIAC: RRR, no murmurs, rubs, gallops RESPIRATORY:  Clear to auscultation without rales, wheezing or rhonchi  ABDOMEN: Soft, non-tender, non-distended MUSCULOSKELETAL:  No edema; No deformity  SKIN: Warm and dry NEUROLOGIC:  Alert and oriented x 3 PSYCHIATRIC:  Normal affect   ASSESSMENT:    1. Paroxysmal atrial fibrillation (HCC)   2. Coronary artery disease of native artery of native heart with stable angina pectoris (HCC)   3. Elevated coronary artery calcium score   4. Chest pain of uncertain etiology   5. Precordial pain   6. Dyslipidemia (high LDL; low HDL)    PLAN:    In order of problems listed above:  Paroxysmal atrial fibrillation: Discussed the risk of embolic stroke is the most serious possible complication.  Need to check an echocardiogram.  Started Eliquis 5 mg twice daily.  Also start metoprolol succinate 25 mg once daily for better rate control.  Did not get a chance to talk today about compliance with CPAP or any use of alcohol, but need to review that at the next appointment.  He is not particularly symptomatic from the atrial fibrillation, but he has episodes of relatively severe tachycardia, thankfully not sustained for long periods of time.  Discussed the risk of tachycardia cardiomyopathy and heart failure.  If we cannot achieve good rate control may need to  adopt an antiarrhythmic strategy.  Since the monitor in my view  strongly suggest pulmonary vein tachycardia he may be a good candidate for an early approach with ablation, rather than antiarrhythmic drugs.  We also briefly discussed the Watchman procedure if he has bleeding issues with anticoagulants. CAD: It is difficult to really tease out whether his chest discomfort is angina pectoris precipitated by rapid tachycardia.  This may be because the episodes are frequent but brief, making it hard to definitely show they are associated.  He definitely needs further evaluation for possible significant coronary stenoses.  His symptoms are not severe enough to warrant invasive coronary angiography at this time and I am concerned that coronary CT angiography will be plagued by artifact due to the heavy calcification and the frequent arrhythmia.  He will be best served by perfusion study.  SPECT may be misleading due to the widespread nature of his coronary disease.  He will be best served by PET scan. HLP: Hopefully, his very severely elevated calcium score is an expression of successful pacification of his coronary plaques with aggressive lipid-lowering.  Nevertheless I would recommend a target LDL cholesterol less than 70.  Add Zetia 10 mg daily and recheck in a few weeks. OSA: Has been using CPAP and was followed by Dr. Annalee Genta at least until 2021.  Need to clarify with Behruz whether he is still conscientiously using the CPAP. History of TMJ replacement surgery: Keep this in mind when considering procedures that require general anesthesia and intubation.           Medication Adjustments/Labs and Tests Ordered: Current medicines are reviewed at length with the patient today.  Concerns regarding medicines are outlined above.  Orders Placed This Encounter  Procedures   NM PET CT CARDIAC PERFUSION MULTI W/ABSOLUTE BLOODFLOW   Lipid panel   Cardiac Stress Test: Informed Consent Details:  Physician/Practitioner Attestation; Transcribe to consent form and obtain patient signature   EKG 12-Lead   ECHOCARDIOGRAM COMPLETE   Meds ordered this encounter  Medications   ezetimibe (ZETIA) 10 MG tablet    Sig: Take 1 tablet (10 mg total) by mouth daily.    Dispense:  90 tablet    Refill:  3   apixaban (ELIQUIS) 5 MG TABS tablet    Sig: Take 1 tablet (5 mg total) by mouth 2 (two) times daily.    Dispense:  60 tablet    Refill:  11   metoprolol succinate (TOPROL XL) 25 MG 24 hr tablet    Sig: Take 1 tablet (25 mg total) by mouth daily.    Dispense:  90 tablet    Refill:  3    Patient Instructions  Medication Instructions:  STOP: ASPIRIN START: ZETIA 10 mg EVERY MORNING METOPROLOL SUCCINATE 25 MG DAILY ELIQUIS 5 MG TWICE A DAY *If you need a refill on your cardiac medications before your next appointment, please call your pharmacy*   Lab Work: LIPID PANEL- Please return for Blood Work in 3 MONTHS. No appointment needed, lab here at the office is open Monday-Friday from 8AM to 4PM and closed daily for lunch from 12:45-1:45.   If you have labs (blood work) drawn today and your tests are completely normal, you will receive your results only by: MyChart Message (if you have MyChart) OR A paper copy in the mail If you have any lab test that is abnormal or we need to change your treatment, we will call you to review the results.   Testing/Procedures: Your physician has requested that you have an echocardiogram. Echocardiography is a painless test  that uses sound waves to create images of your heart. It provides your doctor with information about the size and shape of your heart and how well your heart's chambers and valves are working. This procedure takes approximately one hour. There are no restrictions for this procedure. Please do NOT wear cologne, perfume, aftershave, or lotions (deodorant is allowed). Please arrive 15 minutes prior to your appointment time.   How to  Prepare for Your Cardiac PET/CT Stress Test:  1.  Take all regularly scheduled medications    2. Nothing to eat or drink, except water, 3 hours prior to arrival time.   NO caffeine/decaffeinated products, or chocolate 12 hours prior to arrival.  3. NO perfume, cologne or lotion on chest or abdomen area.            4. Total time is 1 to 2 hours; you may want to bring reading material for the waiting time.  5. Please report to Radiology at the [facility] Main Entrance 30 minutes early for your test.  [address] [place], [place] [address]  In preparation for your appointment, medication and supplies will be purchased.  Appointment availability is limited, so if you need to cancel or reschedule, please call the Radiology Department at [phone] [facility]) OR [phone] [facility])  24 hours in advance to avoid a cancellation fee of $100.00  What to Expect After you Arrive:  Once you arrive and check in for your appointment, you will be taken to a preparation room within the Radiology Department.  A technologist or Nurse will obtain your medical history, verify that you are correctly prepped for the exam, and explain the procedure.  Afterwards,  an IV will be started in your arm and electrodes will be placed on your skin for EKG monitoring during the stress portion of the exam. Then you will be escorted to the PET/CT scanner.  There, staff will get you positioned on the scanner and obtain a blood pressure and EKG.  During the exam, you will continue to be connected to the EKG and blood pressure machines.  A small, safe amount of a radioactive tracer will be injected in your IV to obtain a series of pictures of your heart along with an injection of a stress agent.    After your Exam:  It is recommended that you eat a meal and drink a caffeinated beverage to counter act any effects of the stress agent.  Drink plenty of fluids for the remainder of the day and urinate frequently  for the first couple of hours after the exam.  Your doctor will inform you of your test results within 7-10 business days.  For more information and frequently asked questions, please visit our website : [email or web address]  For questions about your test or how to prepare for your test, please call: Cardiac Imaging Nurse Navigators Office: [phone]    Follow-Up: At [facility], you and your health needs are our priority.  As part of our continuing mission to provide you with exceptional heart care, we have created designated Provider Care Teams.  These Care Teams include your primary Cardiologist (physician) and Advanced Practice Providers (APPs -  Physician Assistants and Nurse Practitioners) who all work together to provide you with the care you need, when you need it.  We recommend signing up for the patient portal called "MyChart".  Sign up information is provided on this After Visit Summary.  MyChart is used to connect with patients for Virtual  Visits (Telemedicine).  Patients are able to view lab/test results, encounter notes, upcoming appointments, etc.  Non-urgent messages can be sent to your provider as well.   To learn more about what you can do with MyChart, go to [email or web address].    Your next appointment:   4 month(s)  Provider:   Dr Royann Shivers    Signed, Thurmon Fair, MD  02/16/2023 5:11 PM    [facility]

## 2023-02-16 NOTE — Progress Notes (Signed)
Cardiology Office Note:    Date:  02/16/2023   ID:  KNOWLTON ENNIS, DOB 05-16-1956, MRN [id number]  PCP:  Emilio Aspen, MD   [facility] Providers Cardiologist:  None     Referring MD: Emilio Aspen, *   No chief complaint on file. SNAYDER CADE is a 67 y.o. male who is being seen today for the evaluation of paroxysmal atrial fibrillation and elevated coronary calcium score at the request of Emilio Aspen, *.   History of Present Illness:    CHARELS BRENIZER is a 67 y.o. male with a hx of dyslipidemia who was recently had problems with chest discomfort and palpitations.  He is "always on the go" and denies limitations related to dyspnea or angina with exertion.  On the other hand he has a frequent sensation of chest tenderness that is present both at rest or with activity and could occur without obvious provocation.  He does not have orthopnea, PND, lower extremity edema or daytime hypersomnolence to suggest sleep apnea.  He has palpitations which he described as a sudden "adrenaline rush".  Describes shooting pain in his back that sounds neuralgic.  He has not experienced dizziness or syncope.  He does not have a history of stroke, TIA or other embolic events.  He had a recent coronary calcium score that was markedly elevated at 1920, placing him in the 95th percentile.  There is also atheromatous calcification of the aorta.  He wore a 2-week event monitor that shows a high burden of paroxysmal atrial fibrillation at 9%.  As a general rule, this is more common during daytime hours, presumed during activity and is much less frequent at night.  Some of the episodes are brief bursts of ectopic atrial tachycardia, with a pattern suggesting pulmonary vein tachycardia.  The longest episode is only 1 hour and 43 minutes in duration.  As a rule, he has rapid ventricular rates (150s-190s), but sometimes at night the ventricular rate is well-controlled.  He has frequent  premature supraventricular contractions cumulatively representing about 15 of all recorded beats and he also has occasional PVCs (around 1.2 %, no episodes of VT).  He has a history of sleep apnea treated with CPAP, followed by Dr. Annalee Genta in the ENT clinic.  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  He has a remote history of hiatal hernia repair.  Has been taking lipid-lowering drugs for about 30 years now.  His most recent LDL cholesterol was 91, HDL 37, normal triglycerides on simvastatin 80 mg daily.  He was switched to atorvastatin 40 mg daily.  Past Medical History:  Diagnosis Date   Cancer [facility])    skin cancer on nose 2017   Difficult intubation    Needs to use pediatric intubation due to TMJ surgery   GERD (gastroesophageal reflux disease)    History of hiatal hernia    Nocturnal leg cramps 04/04/2017   Peripheral neuropathy 12/05/2016   Sleep apnea     Past Surgical History:  Procedure Laterality Date   COLONOSCOPY WITH PROPOFOL N/A 05/16/2016   Procedure: COLONOSCOPY WITH PROPOFOL;  Surgeon: Charolett Bumpers, MD;  Location: [facility] ENDOSCOPY;  Service: Endoscopy;  Laterality: N/A;   HIATAL HERNIA REPAIR     14 years ago   LUMBAR DISC SURGERY  2014   L4   total TMJ replacement Bilateral    15-18 years ago  Current Medications: Current Meds  Medication Sig   apixaban (ELIQUIS) 5 MG TABS tablet Take 1 tablet (5 mg total) by mouth 2 (two) times daily.   ascorbic acid (VITAMIN C) 1000 MG tablet Take 1,000 mg by mouth 2 (two) times daily.   atorvastatin (LIPITOR) 40 MG tablet Take 40 mg by mouth daily.   B Complex-Folic Acid (B COMPLEX VITAMINS, W/ FA,) CAPS Take 1 capsule by mouth daily.   cholecalciferol (VITAMIN D3) 25 MCG (1000 UNIT) tablet Take 1,000 Units by mouth daily.   Coenzyme Q10 (COQ10) 100 MG CAPS Take 1 capsule by mouth in the morning and at bedtime.   Cyanocobalamin  (B-12) 2500 MCG TABS Take 2,500 mcg by mouth daily.   doxycycline (ADOXA) 100 MG tablet Take 100 mg by mouth every other day.   ezetimibe (ZETIA) 10 MG tablet Take 1 tablet (10 mg total) by mouth daily.   fluticasone (FLONASE) 50 MCG/ACT nasal spray Place 2 sprays into both nostrils daily as needed for allergies or rhinitis.   ibuprofen (ADVIL) 100 MG tablet Take 200 mg by mouth in the morning and at bedtime.   loratadine-pseudoephedrine (CLARITIN-D 12 HOUR) 5-120 MG tablet Take 1 tablet by mouth daily.   Magnesium 250 MG TABS Take 1 tablet by mouth daily.   metoprolol succinate (TOPROL XL) 25 MG 24 hr tablet Take 1 tablet (25 mg total) by mouth daily.   Multiple Vitamins-Minerals (MULTIVITAMIN WITH MINERALS) tablet Take 1 tablet by mouth daily.   Multiple Vitamins-Minerals (PRESERVISION AREDS 2+MULTI VIT PO) Take 1 tablet by mouth in the morning and at bedtime.   Omega-3 Fatty Acids (FISH OIL PO) Take 1,000 mg by mouth 2 (two) times daily.   omeprazole (PRILOSEC) 40 MG capsule Take 40 mg by mouth daily.   Selenium 200 MCG TABS Take 1 tablet by mouth daily.   tretinoin (RETIN-A) 0.1 % cream Apply 0.1 % topically as needed.   vitamin C (ASCORBIC ACID) 500 MG tablet Take 500-1,000 mg by mouth daily.   [DISCONTINUED] aspirin 81 MG tablet Take 81 mg by mouth daily.   [DISCONTINUED] Cholecalciferol (VITAMIN D3) 1000 units CAPS Take 3,000 Units by mouth daily.   [DISCONTINUED] zolpidem (AMBIEN CR) 6.25 MG CR tablet Take 6.25 mg by mouth at bedtime as needed.     Allergies:   Other and Erythromycin   Social History   Socioeconomic History   Marital status: Single    Spouse name: Not on file   Number of children: 0   Years of education: 2 years college   Highest education level: Not on file  Occupational History   Occupation: Surveyor, minerals  Tobacco Use   Smoking status: Never   Smokeless tobacco: Never  Vaping Use   Vaping status: Never Used  Substance and Sexual Activity   Alcohol use:  Yes    Comment: 2 beers or mixed drinks a week   Drug use: No   Sexual activity: Not on file  Other Topics Concern   Not on file  Social History Narrative   Lives at home with his girlfriend.   Right-handed.   3 cups caffeine daily.   Social Determinants of Health   Financial Resource Strain: Not on file  Food Insecurity: Not on file  Transportation Needs: Not on file  Physical Activity: Not on file  Stress: Not on file  Social Connections: Not on file     Family History: The patient's family history includes cerebral aneurysm in his mother, who died in  her 60s.  Multiple family members had heart problems, but not at young ages.  ROS:   Please see the history of present illness.     All other systems reviewed and are negative.  EKGs/Labs/Other Studies Reviewed:    The following studies were reviewed today:  Event monitor described above with roughly 9% burden of atrial fibrillation with rapid ventricular response, 50% burden of premature atrial beats and occasional PVCs.  EKG Interpretation Date/Time:  Thursday February 16 2023 11:14:16 EDT Ventricular Rate:  68 PR Interval:  184 QRS Duration:  104 QT Interval:  392 QTC Calculation: 416 R Axis:   45  Text Interpretation: Sinus rhythm with occasional Premature ventricular complexes When compared with ECG of 09-Feb-2023 19:06, Premature ventricular complexes are now Present Confirmed by Maila Dukes [phone]) on 02/16/2023 11:30:18 AM    Recent Labs: 02/09/2023: BUN 25; Creatinine, Ser 1.21; Hemoglobin 15.5; Platelets 170; Potassium 4.0; Sodium 140  Recent Lipid Panel No results found for: "CHOL", "TRIG", "HDL", "CHOLHDL", "VLDL", "LDLCALC", "LDLDIRECT" 12/20/2022 cholesterol 141, HDL 37, LDL 91, triglycerides 69 TSH 2.010   Risk Assessment/Calculations:    CHA2DS2-VASc Score = 2   This indicates a 2.2% annual risk of stroke. The patient's score is based upon: CHF History: 0 HTN History: 0 Diabetes History:  0 Stroke History: 0 Vascular Disease History: 1 Age Score: 1 Gender Score: 0               Physical Exam:    VS:  BP 128/82 (BP Location: Left Arm, Patient Position: Sitting, Cuff Size: Normal)   Pulse 68   Ht 6\' 2"  (1.88 m)   Wt 197 lb 6.4 oz (89.5 kg)   SpO2 97%   BMI 25.34 kg/m     Wt Readings from Last 3 Encounters:  02/16/23 197 lb 6.4 oz (89.5 kg)  02/09/23 191 lb (86.6 kg)  04/04/17 224 lb (101.6 kg)     GEN: Appears fit and younger than stated age, well nourished, well developed in no acute distress HEENT: Normal NECK: No JVD; No carotid bruits LYMPHATICS: No lymphadenopathy CARDIAC: RRR, no murmurs, rubs, gallops RESPIRATORY:  Clear to auscultation without rales, wheezing or rhonchi  ABDOMEN: Soft, non-tender, non-distended MUSCULOSKELETAL:  No edema; No deformity  SKIN: Warm and dry NEUROLOGIC:  Alert and oriented x 3 PSYCHIATRIC:  Normal affect   ASSESSMENT:    1. Paroxysmal atrial fibrillation (HCC)   2. Coronary artery disease of native artery of native heart with stable angina pectoris (HCC)   3. Elevated coronary artery calcium score   4. Chest pain of uncertain etiology   5. Precordial pain   6. Dyslipidemia (high LDL; low HDL)    PLAN:    In order of problems listed above:  Paroxysmal atrial fibrillation: Discussed the risk of embolic stroke is the most serious possible complication.  Need to check an echocardiogram.  Started Eliquis 5 mg twice daily.  Also start metoprolol succinate 25 mg once daily for better rate control.  Did not get a chance to talk today about compliance with CPAP or any use of alcohol, but need to review that at the next appointment.  He is not particularly symptomatic from the atrial fibrillation, but he has episodes of relatively severe tachycardia, thankfully not sustained for long periods of time.  Discussed the risk of tachycardia cardiomyopathy and heart failure.  If we cannot achieve good rate control may need to  adopt an antiarrhythmic strategy.  Since the monitor in my view  strongly suggest pulmonary vein tachycardia he may be a good candidate for an early approach with ablation, rather than antiarrhythmic drugs.  We also briefly discussed the Watchman procedure if he has bleeding issues with anticoagulants. CAD: It is difficult to really tease out whether his chest discomfort is angina pectoris precipitated by rapid tachycardia.  This may be because the episodes are frequent but brief, making it hard to definitely show they are associated.  He definitely needs further evaluation for possible significant coronary stenoses.  His symptoms are not severe enough to warrant invasive coronary angiography at this time and I am concerned that coronary CT angiography will be plagued by artifact due to the heavy calcification and the frequent arrhythmia.  He will be best served by perfusion study.  SPECT may be misleading due to the widespread nature of his coronary disease.  He will be best served by PET scan. HLP: Hopefully, his very severely elevated calcium score is an expression of successful pacification of his coronary plaques with aggressive lipid-lowering.  Nevertheless I would recommend a target LDL cholesterol less than 70.  Add Zetia 10 mg daily and recheck in a few weeks. OSA: Has been using CPAP and was followed by Dr. Annalee Genta at least until 2021.  Need to clarify with Behruz whether he is still conscientiously using the CPAP. History of TMJ replacement surgery: Keep this in mind when considering procedures that require general anesthesia and intubation.           Medication Adjustments/Labs and Tests Ordered: Current medicines are reviewed at length with the patient today.  Concerns regarding medicines are outlined above.  Orders Placed This Encounter  Procedures   NM PET CT CARDIAC PERFUSION MULTI W/ABSOLUTE BLOODFLOW   Lipid panel   Cardiac Stress Test: Informed Consent Details:  Physician/Practitioner Attestation; Transcribe to consent form and obtain patient signature   EKG 12-Lead   ECHOCARDIOGRAM COMPLETE   Meds ordered this encounter  Medications   ezetimibe (ZETIA) 10 MG tablet    Sig: Take 1 tablet (10 mg total) by mouth daily.    Dispense:  90 tablet    Refill:  3   apixaban (ELIQUIS) 5 MG TABS tablet    Sig: Take 1 tablet (5 mg total) by mouth 2 (two) times daily.    Dispense:  60 tablet    Refill:  11   metoprolol succinate (TOPROL XL) 25 MG 24 hr tablet    Sig: Take 1 tablet (25 mg total) by mouth daily.    Dispense:  90 tablet    Refill:  3    Patient Instructions  Medication Instructions:  STOP: ASPIRIN START: ZETIA 10 mg EVERY MORNING METOPROLOL SUCCINATE 25 MG DAILY ELIQUIS 5 MG TWICE A DAY *If you need a refill on your cardiac medications before your next appointment, please call your pharmacy*   Lab Work: LIPID PANEL- Please return for Blood Work in 3 MONTHS. No appointment needed, lab here at the office is open Monday-Friday from 8AM to 4PM and closed daily for lunch from 12:45-1:45.   If you have labs (blood work) drawn today and your tests are completely normal, you will receive your results only by: MyChart Message (if you have MyChart) OR A paper copy in the mail If you have any lab test that is abnormal or we need to change your treatment, we will call you to review the results.   Testing/Procedures: Your physician has requested that you have an echocardiogram. Echocardiography is a painless test  that uses sound waves to create images of your heart. It provides your doctor with information about the size and shape of your heart and how well your heart's chambers and valves are working. This procedure takes approximately one hour. There are no restrictions for this procedure. Please do NOT wear cologne, perfume, aftershave, or lotions (deodorant is allowed). Please arrive 15 minutes prior to your appointment time.   How to  Prepare for Your Cardiac PET/CT Stress Test:  1.  Take all regularly scheduled medications    2. Nothing to eat or drink, except water, 3 hours prior to arrival time.   NO caffeine/decaffeinated products, or chocolate 12 hours prior to arrival.  3. NO perfume, cologne or lotion on chest or abdomen area.            4. Total time is 1 to 2 hours; you may want to bring reading material for the waiting time.  5. Please report to Radiology at the [facility] Main Entrance 30 minutes early for your test.  [address] [place], [place] [address]  In preparation for your appointment, medication and supplies will be purchased.  Appointment availability is limited, so if you need to cancel or reschedule, please call the Radiology Department at [phone] [facility]) OR [phone] [facility])  24 hours in advance to avoid a cancellation fee of $100.00  What to Expect After you Arrive:  Once you arrive and check in for your appointment, you will be taken to a preparation room within the Radiology Department.  A technologist or Nurse will obtain your medical history, verify that you are correctly prepped for the exam, and explain the procedure.  Afterwards,  an IV will be started in your arm and electrodes will be placed on your skin for EKG monitoring during the stress portion of the exam. Then you will be escorted to the PET/CT scanner.  There, staff will get you positioned on the scanner and obtain a blood pressure and EKG.  During the exam, you will continue to be connected to the EKG and blood pressure machines.  A small, safe amount of a radioactive tracer will be injected in your IV to obtain a series of pictures of your heart along with an injection of a stress agent.    After your Exam:  It is recommended that you eat a meal and drink a caffeinated beverage to counter act any effects of the stress agent.  Drink plenty of fluids for the remainder of the day and urinate frequently  for the first couple of hours after the exam.  Your doctor will inform you of your test results within 7-10 business days.  For more information and frequently asked questions, please visit our website : [email or web address]  For questions about your test or how to prepare for your test, please call: Cardiac Imaging Nurse Navigators Office: [phone]    Follow-Up: At [facility], you and your health needs are our priority.  As part of our continuing mission to provide you with exceptional heart care, we have created designated Provider Care Teams.  These Care Teams include your primary Cardiologist (physician) and Advanced Practice Providers (APPs -  Physician Assistants and Nurse Practitioners) who all work together to provide you with the care you need, when you need it.  We recommend signing up for the patient portal called "MyChart".  Sign up information is provided on this After Visit Summary.  MyChart is used to connect with patients for Virtual  Visits (Telemedicine).  Patients are able to view lab/test results, encounter notes, upcoming appointments, etc.  Non-urgent messages can be sent to your provider as well.   To learn more about what you can do with MyChart, go to [email or web address].    Your next appointment:   4 month(s)  Provider:   Dr Royann Shivers    Signed, Thurmon Fair, MD  02/16/2023 5:11 PM    [facility]

## 2023-02-20 ENCOUNTER — Telehealth (HOSPITAL_COMMUNITY): Payer: Self-pay | Admitting: *Deleted

## 2023-02-20 NOTE — Telephone Encounter (Signed)
Reaching out to patient to offer assistance regarding upcoming cardiac imaging study; pt verbalizes understanding of appt date/time, parking situation and where to check in, pre-test NPO status and verified current allergies; name and call back number provided for further questions should they arise  Danette Weinfeld RN Navigator Cardiac Imaging [facility] [phone] office [phone] cell  Patient aware to avoid caffeine 12 hours prior to his cardiac PET scan. 

## 2023-02-22 ENCOUNTER — Institutional Professional Consult (permissible substitution) (HOSPITAL_BASED_OUTPATIENT_CLINIC_OR_DEPARTMENT_OTHER): Payer: BC Managed Care – PPO | Admitting: Nurse Practitioner

## 2023-02-22 ENCOUNTER — Ambulatory Visit (HOSPITAL_COMMUNITY)
Admission: RE | Admit: 2023-02-22 | Discharge: 2023-02-22 | Disposition: A | Discharge status: Home / Self Care | Payer: PPO | Source: Ambulatory Visit | Attending: Cardiovascular Disease | Admitting: Cardiovascular Disease

## 2023-02-22 DIAGNOSIS — R079 Chest pain, unspecified: Secondary | ICD-10-CM | POA: Diagnosis not present

## 2023-02-22 DIAGNOSIS — R072 Precordial pain: Secondary | ICD-10-CM | POA: Insufficient documentation

## 2023-02-22 MED ORDER — REGADENOSON 0.4 MG/5ML IV SOLN
0.4000 mg | Freq: Once | INTRAVENOUS | Status: AC
  Administered 2023-02-22: 0.4 mg via INTRAVENOUS

## 2023-02-22 MED ORDER — RUBIDIUM RB82 GENERATOR (RUBYFILL)
23.2000 | PACK | Freq: Once | INTRAVENOUS | Status: AC
  Administered 2023-02-22: 23.2 via INTRAVENOUS

## 2023-02-22 MED ORDER — REGADENOSON 0.4 MG/5ML IV SOLN
INTRAVENOUS | Status: AC
  Filled 2023-02-22: qty 5

## 2023-02-23 LAB — NM PET CT CARDIAC PERFUSION MULTI W/ABSOLUTE BLOODFLOW
MBFR: 2.43
Nuc Rest EF: 45 %
Nuc Stress EF: 55 %
Rest MBF: 0.94 ml/g/min
Rest Nuclear Isotope Dose: 23.2 mCi
ST Depression (mm): 0 mm
Stress MBF: 2.28 ml/g/min
Stress Nuclear Isotope Dose: 23.2 mCi
TID: 1.01

## 2023-02-27 ENCOUNTER — Telehealth: Payer: Self-pay | Admitting: Emergency Medicine

## 2023-02-27 NOTE — Telephone Encounter (Signed)
Pt returned call, unable to talk and told him I would return call soon.

## 2023-02-27 NOTE — Telephone Encounter (Signed)
Called to go over instructions- left message

## 2023-02-27 NOTE — Telephone Encounter (Signed)
Called to inform patient of Heart Catheterization scheduled and to go over instructions. Left message with Call back number. Also sent information over MyChart. (Instructions below) [facility] A DEPT OF [facility] AT [facility] [address] [place] [address] [place] [address] Dept: [phone] Loc: [phone]  Andrew [name]  02/27/2023  You are scheduled for a Cardiac Catheterization on Friday, October 11 with Dr. Verne Carrow.  1. Please arrive at the [facility] (Main Entrance A) at [facility]: [address] [place], [place] [address] at 5:30 AM (This time is 2 hour(s) before your procedure to ensure your preparation). Free valet parking service is available. You will check in at ADMITTING. The support person will be asked to wait in the waiting room.  It is OK to have someone drop you off and come back when you are ready to be discharged.    Special note: Every effort is made to have your procedure done on time. Please understand that emergencies sometimes delay scheduled procedures.  2. Diet: Do not eat solid foods after midnight.  The patient may have clear liquids until 5am upon the day of the procedure.  3. Labs:  Labs drawn 02/09/23 (good for one month)  4. Medication instructions in preparation for your procedure:   Contrast Allergy: No   Stop Taking Eliquis on Wednesday Oct 9th. Your last dose will be on Tuesday evening Oct 8th.   On the morning of your procedure, you may all (EXCEPT ELIQUIS) of your other regularly scheduled medications with a sip of water. (NOT ELIQUIS)  5. Plan to go home the same day, you will only stay overnight if medically necessary. 6. Bring a current list of your medications and current insurance cards. 7. You MUST have a responsible person to drive you home. 8. Someone MUST be with you the first 24 hours after you arrive home or your discharge will be delayed. 9. Please  wear clothes that are easy to get on and off and wear slip-on shoes.  Thank you for allowing [place] to care for you!   -- [facility]

## 2023-02-27 NOTE — Telephone Encounter (Signed)
Called and went over all instructions/information with the patient. He verbalized understanding.

## 2023-03-07 ENCOUNTER — Ambulatory Visit (HOSPITAL_COMMUNITY): Payer: PPO | Attending: Cardiovascular Disease

## 2023-03-07 DIAGNOSIS — I480 Paroxysmal atrial fibrillation: Secondary | ICD-10-CM | POA: Diagnosis not present

## 2023-03-07 LAB — ECHOCARDIOGRAM COMPLETE
Area-P 1/2: 3.91 cm2
S' Lateral: 3 cm

## 2023-03-08 ENCOUNTER — Telehealth: Payer: Self-pay | Admitting: *Deleted

## 2023-03-08 NOTE — Telephone Encounter (Signed)
Cardiac Catheterization scheduled at [facility] for: Friday March 10, 2023 7:30 AM Arrival time [facility] Main Entrance A at: 5:30 AM  Nothing to eat after midnight prior to procedure, clear liquids until 5 AM day of procedure.  Medication instructions: -Hold:  Eliquis-pt reports last dose AM 03/08/23 -knows to hold until post procedure -Other usual morning medications can be taken with sips of water including aspirin 81 mg.  Plan to go home the same day, you will only stay overnight if medically necessary.  You must have responsible adult to drive you home.  Someone must be with you the first 24 hours after you arrive home.  Reviewed procedure instructions with patient.

## 2023-03-10 ENCOUNTER — Other Ambulatory Visit (HOSPITAL_COMMUNITY): Payer: Self-pay

## 2023-03-10 ENCOUNTER — Other Ambulatory Visit: Payer: Self-pay

## 2023-03-10 ENCOUNTER — Ambulatory Visit (HOSPITAL_COMMUNITY)
Admission: RE | Admit: 2023-03-10 | Discharge: 2023-03-10 | Disposition: A | Discharge status: Home / Self Care | Payer: PPO | Attending: Cardiovascular Disease | Admitting: Cardiovascular Disease

## 2023-03-10 ENCOUNTER — Encounter (HOSPITAL_COMMUNITY)
Admission: RE | Disposition: A | Discharge status: Home / Self Care | Payer: Self-pay | Source: Home / Self Care | Attending: Cardiovascular Disease

## 2023-03-10 DIAGNOSIS — Z7901 Long term (current) use of anticoagulants: Secondary | ICD-10-CM | POA: Insufficient documentation

## 2023-03-10 DIAGNOSIS — I25118 Atherosclerotic heart disease of native coronary artery with other forms of angina pectoris: Secondary | ICD-10-CM | POA: Insufficient documentation

## 2023-03-10 DIAGNOSIS — Z8719 Personal history of other diseases of the digestive system: Secondary | ICD-10-CM | POA: Insufficient documentation

## 2023-03-10 DIAGNOSIS — E785 Hyperlipidemia, unspecified: Secondary | ICD-10-CM | POA: Diagnosis not present

## 2023-03-10 DIAGNOSIS — I480 Paroxysmal atrial fibrillation: Secondary | ICD-10-CM | POA: Diagnosis not present

## 2023-03-10 DIAGNOSIS — Z8249 Family history of ischemic heart disease and other diseases of the circulatory system: Secondary | ICD-10-CM | POA: Insufficient documentation

## 2023-03-10 DIAGNOSIS — I200 Unstable angina: Secondary | ICD-10-CM | POA: Diagnosis present

## 2023-03-10 DIAGNOSIS — Z955 Presence of coronary angioplasty implant and graft: Secondary | ICD-10-CM

## 2023-03-10 DIAGNOSIS — Z79899 Other long term (current) drug therapy: Secondary | ICD-10-CM | POA: Insufficient documentation

## 2023-03-10 DIAGNOSIS — I25110 Atherosclerotic heart disease of native coronary artery with unstable angina pectoris: Secondary | ICD-10-CM | POA: Diagnosis not present

## 2023-03-10 DIAGNOSIS — G4733 Obstructive sleep apnea (adult) (pediatric): Secondary | ICD-10-CM | POA: Insufficient documentation

## 2023-03-10 HISTORY — PX: CORONARY STENT INTERVENTION: CATH118234

## 2023-03-10 HISTORY — PX: CORONARY LITHOTRIPSY: CATH118330

## 2023-03-10 HISTORY — PX: LEFT HEART CATH AND CORONARY ANGIOGRAPHY: CATH118249

## 2023-03-10 LAB — POCT ACTIVATED CLOTTING TIME
Activated Clotting Time: 324 s
Activated Clotting Time: 275 s

## 2023-03-10 SURGERY — LEFT HEART CATH AND CORONARY ANGIOGRAPHY
Anesthesia: LOCAL

## 2023-03-10 MED ORDER — IOHEXOL 350 MG/ML SOLN
INTRAVENOUS | Status: DC | PRN
  Administered 2023-03-10: 140 mL

## 2023-03-10 MED ORDER — CLOPIDOGREL BISULFATE 75 MG PO TABS: 75.0000 mg | ORAL_TABLET | Freq: Every day | ORAL | Status: DC

## 2023-03-10 MED ORDER — ONDANSETRON HCL 4 MG/2ML IJ SOLN: 4.0000 mg | Freq: Four times a day (QID) | INTRAMUSCULAR | Status: DC | PRN

## 2023-03-10 MED ORDER — SODIUM CHLORIDE 0.9 % WEIGHT BASED INFUSION
3.0000 mL/kg/h | INTRAVENOUS | Status: DC
  Administered 2023-03-10: 3 mL/kg/h via INTRAVENOUS

## 2023-03-10 MED ORDER — ASPIRIN 81 MG PO CHEW: 81.0000 mg | CHEWABLE_TABLET | Freq: Every day | ORAL | Status: DC

## 2023-03-10 MED ORDER — CLOPIDOGREL BISULFATE 300 MG PO TABS
ORAL_TABLET | ORAL | Status: AC
  Filled 2023-03-10: qty 2

## 2023-03-10 MED ORDER — FENTANYL CITRATE (PF) 100 MCG/2ML IJ SOLN
INTRAMUSCULAR | Status: DC | PRN
  Administered 2023-03-10: 50 ug via INTRAVENOUS
  Administered 2023-03-10: 25 ug via INTRAVENOUS

## 2023-03-10 MED ORDER — METOPROLOL SUCCINATE ER 25 MG PO TB24: 25.0000 mg | ORAL_TABLET | Freq: Every day | ORAL | Status: DC

## 2023-03-10 MED ORDER — HEPARIN SODIUM (PORCINE) 1000 UNIT/ML IJ SOLN
INTRAMUSCULAR | Status: DC | PRN
  Administered 2023-03-10: 2000 [IU] via INTRAVENOUS
  Administered 2023-03-10: 4000 [IU] via INTRAVENOUS
  Administered 2023-03-10: 6000 [IU] via INTRAVENOUS

## 2023-03-10 MED ORDER — CLOPIDOGREL BISULFATE 300 MG PO TABS
ORAL_TABLET | ORAL | Status: DC | PRN
  Administered 2023-03-10: 600 mg via ORAL

## 2023-03-10 MED ORDER — SODIUM CHLORIDE 0.9 % WEIGHT BASED INFUSION: 1.0000 mL/kg/h | INTRAVENOUS | Status: DC

## 2023-03-10 MED ORDER — MIDAZOLAM HCL 2 MG/2ML IJ SOLN
INTRAMUSCULAR | Status: DC | PRN
  Administered 2023-03-10: 1 mg via INTRAVENOUS
  Administered 2023-03-10: 2 mg via INTRAVENOUS

## 2023-03-10 MED ORDER — VERAPAMIL HCL 2.5 MG/ML IV SOLN
INTRAVENOUS | Status: DC | PRN
  Administered 2023-03-10: 10 mL via INTRA_ARTERIAL

## 2023-03-10 MED ORDER — PANTOPRAZOLE SODIUM 40 MG PO TBEC
40.0000 mg | DELAYED_RELEASE_TABLET | ORAL | 4 refills | Status: DC
  Filled 2023-03-10 – 2023-06-06 (×2): qty 30, 70d supply, fill #0
  Filled 2023-08-26: qty 30, 70d supply, fill #1
  Filled 2023-11-14: qty 30, 70d supply, fill #2
  Filled 2024-02-25: qty 30, 70d supply, fill #3

## 2023-03-10 MED ORDER — APIXABAN 5 MG PO TABS: 5.0000 mg | ORAL_TABLET | Freq: Two times a day (BID) | ORAL | Status: DC

## 2023-03-10 MED ORDER — FENTANYL CITRATE (PF) 100 MCG/2ML IJ SOLN
INTRAMUSCULAR | Status: AC
  Filled 2023-03-10: qty 2

## 2023-03-10 MED ORDER — VERAPAMIL HCL 2.5 MG/ML IV SOLN
INTRAVENOUS | Status: AC
  Filled 2023-03-10: qty 2

## 2023-03-10 MED ORDER — LIDOCAINE HCL (PF) 1 % IJ SOLN
INTRAMUSCULAR | Status: DC | PRN
  Administered 2023-03-10: 2 mL via INTRADERMAL

## 2023-03-10 MED ORDER — ASPIRIN 81 MG PO TBEC
81.0000 mg | DELAYED_RELEASE_TABLET | Freq: Every day | ORAL | 0 refills | Status: DC
  Filled 2023-03-10: qty 30, 30d supply, fill #0

## 2023-03-10 MED ORDER — SODIUM CHLORIDE 0.9 % IV SOLN: INTRAVENOUS | Status: AC

## 2023-03-10 MED ORDER — HEPARIN (PORCINE) IN NACL 1000-0.9 UT/500ML-% IV SOLN
INTRAVENOUS | Status: DC | PRN
  Administered 2023-03-10 (×2): 500 mL

## 2023-03-10 MED ORDER — LIDOCAINE HCL (PF) 1 % IJ SOLN
INTRAMUSCULAR | Status: AC
  Filled 2023-03-10: qty 30

## 2023-03-10 MED ORDER — HEPARIN SODIUM (PORCINE) 1000 UNIT/ML IJ SOLN
INTRAMUSCULAR | Status: AC
  Filled 2023-03-10: qty 10

## 2023-03-10 MED ORDER — NITROGLYCERIN 1 MG/10 ML FOR IR/CATH LAB
INTRA_ARTERIAL | Status: AC
  Filled 2023-03-10: qty 10

## 2023-03-10 MED ORDER — ASPIRIN 81 MG PO CHEW: 81.0000 mg | CHEWABLE_TABLET | ORAL | Status: DC

## 2023-03-10 MED ORDER — SODIUM CHLORIDE 0.9 % IV SOLN: 250.0000 mL | INTRAVENOUS | Status: DC | PRN

## 2023-03-10 MED ORDER — ACETAMINOPHEN 325 MG PO TABS: 650.0000 mg | ORAL_TABLET | ORAL | Status: DC | PRN

## 2023-03-10 MED ORDER — SODIUM CHLORIDE 0.9% FLUSH: 3.0000 mL | Freq: Two times a day (BID) | INTRAVENOUS | Status: DC

## 2023-03-10 MED ORDER — HYDRALAZINE HCL 20 MG/ML IJ SOLN: 10.0000 mg | INTRAMUSCULAR | Status: AC | PRN

## 2023-03-10 MED ORDER — MIDAZOLAM HCL 2 MG/2ML IJ SOLN
INTRAMUSCULAR | Status: AC
  Filled 2023-03-10: qty 2

## 2023-03-10 MED ORDER — EZETIMIBE 10 MG PO TABS: 10.0000 mg | ORAL_TABLET | Freq: Every day | ORAL | Status: DC

## 2023-03-10 MED ORDER — NITROGLYCERIN 1 MG/10 ML FOR IR/CATH LAB
INTRA_ARTERIAL | Status: DC | PRN
  Administered 2023-03-10: 100 ug via INTRA_ARTERIAL

## 2023-03-10 MED ORDER — SODIUM CHLORIDE 0.9% FLUSH: 3.0000 mL | INTRAVENOUS | Status: DC | PRN

## 2023-03-10 MED ORDER — NITROGLYCERIN 0.4 MG SL SUBL
0.4000 mg | SUBLINGUAL_TABLET | SUBLINGUAL | 3 refills | Status: AC | PRN
  Filled 2023-03-10: qty 25, 7d supply, fill #0

## 2023-03-10 MED ORDER — LABETALOL HCL 5 MG/ML IV SOLN: 10.0000 mg | INTRAVENOUS | Status: AC | PRN

## 2023-03-10 MED ORDER — ATORVASTATIN CALCIUM 40 MG PO TABS: 40.0000 mg | ORAL_TABLET | Freq: Every day | ORAL | Status: DC

## 2023-03-10 MED ORDER — CLOPIDOGREL BISULFATE 75 MG PO TABS
75.0000 mg | ORAL_TABLET | Freq: Every day | ORAL | 3 refills | Status: AC
Start: 2023-03-10 — End: 2024-03-12
  Filled 2023-03-10 – 2023-06-06 (×2): qty 90, 90d supply, fill #0
  Filled 2023-08-26: qty 90, 90d supply, fill #1
  Filled 2023-12-12: qty 90, 90d supply, fill #2

## 2023-03-10 SURGICAL SUPPLY — 19 items
BALLN EMERGE MR 2.0X15 (BALLOONS) ×1 IMPLANT
BALLN ~~LOC~~ EUPHORA RX 2.75X15 (BALLOONS) ×1 IMPLANT
BALLOON EMERGE MR 2.0X15 (BALLOONS) IMPLANT
BALLOON ~~LOC~~ EUPHORA RX 2.75X15 (BALLOONS) IMPLANT
CATH 5FR JL3.5 JR4 ANG PIG MP (CATHETERS) IMPLANT
CATH SHOCKWAVE C2 2.5X12 (CATHETERS) IMPLANT
CATH VISTA GUIDE 6FR XBLAD3.5 (CATHETERS) IMPLANT
DEVICE RAD COMP TR BAND LRG (VASCULAR PRODUCTS) IMPLANT
GLIDESHEATH SLEND SS 6F .021 (SHEATH) IMPLANT
GUIDEWIRE INQWIRE 1.5J.035X260 (WIRE) IMPLANT
INQWIRE 1.5J .035X260CM (WIRE) ×1 IMPLANT
KIT ENCORE 26 ADVANTAGE (KITS) IMPLANT
KIT SYRINGE INJ CVI SPIKEX1 (MISCELLANEOUS) IMPLANT
PACK CARDIAC CATHETERIZATION (CUSTOM PROCEDURE TRAY) ×2 IMPLANT
SET ATX-X65L (MISCELLANEOUS) IMPLANT
STENT SYNERGY XD 2.50X20 (Permanent Stent) IMPLANT
STENT SYS SYNERGY XD 2.5X20 (Permanent Stent) ×1 IMPLANT
SYNERGY XD 2.50X20 (Permanent Stent) ×1 IMPLANT
WIRE ASAHI PROWATER 180CM (WIRE) IMPLANT

## 2023-03-10 NOTE — Interval H&P Note (Signed)
History and Physical Interval Note:  03/10/2023 7:39 AM  Andrew [name]  has presented today for surgery, with the diagnosis of cad.  The various methods of treatment have been discussed with the patient and family. After consideration of risks, benefits and other options for treatment, the patient has consented to  Procedure(s): LEFT HEART CATH AND CORONARY ANGIOGRAPHY (N/A) as a surgical intervention.  The patient's history has been reviewed, patient examined, no change in status, stable for surgery.  I have reviewed the patient's chart and labs.  Questions were answered to the patient's satisfaction.    Cath Lab Visit (complete for each Cath Lab visit)  Clinical Evaluation Leading to the Procedure:   ACS: No.  Non-ACS:    Anginal Classification: CCS II  Anti-ischemic medical therapy: Minimal Therapy (1 class of medications)  Non-Invasive Test Results: Intermediate-risk stress test findings: cardiac mortality 1-3%/year  Prior CABG: No previous CABG        Verne Carrow

## 2023-03-10 NOTE — Progress Notes (Signed)
Discussed with pt and fiance stent, restrictions, Plavix importance, diet, exercise, NTG, and CRPII. Pt receptive. Will refer to [place] CRPII.  [id number] Ethelda Chick BS, ACSM-CEP 03/10/2023 12:20 PM

## 2023-03-10 NOTE — Discharge Summary (Signed)
Discharge Summary for Same Day PCI   Patient ID: Andrew [name] MRN: [id number]; DOB: 28-Aug-1955  Admit date: 03/10/2023 Discharge date: 03/10/2023  Primary Care Provider: Emilio Aspen, MD  Primary Cardiologist: Thurmon Fair, MD  Primary Electrophysiologist:  None   Discharge Diagnoses    Active Problems:   Coronary artery disease of native artery of native heart with stable angina pectoris [facility])  Diagnostic Studies/Procedures    Cardiac Catheterization 03/10/2023:    Prox RCA lesion is 30% stenosed.   Mid RCA lesion is 30% stenosed.   Dist RCA lesion is 30% stenosed.   Ost Cx to Prox Cx lesion is 40% stenosed.   Mid Cx to Dist Cx lesion is 40% stenosed.   Dist LAD lesion is 20% stenosed.   Prox LAD to Mid LAD lesion is 80% stenosed.   A drug-eluting stent was successfully placed using a SYNERGY XD 2.50X20.   Post intervention, there is a 0% residual stenosis.   Severe, heavily calcified mid LAD stenosis Successful PTCA/Intravascular Lithotripsy/DES x 1 mid LAD Mild non-obstructive disease in the large, dominant RCA and in the Circumflex   Recommendations: Will plan same day post PCI discharge. Will use DAPT with ASA/Plavix for one month then stop ASA. Resume Eliquis tomorrow. Anticipate 12 months of Plavix therapy.   Diagnostic Dominance: Right  Intervention   _____________   History of Present Illness     Andrew [name] is a 67 y.o. male with past medical history of dyslipidemia, paroxysmal atrial fibrillation, elevated coronary calcium score who was seen in the office on 9/19 with Dr. Royann Shivers.  He had a recent elevated coronary calcium score of 1920 that was placing him in the 95th percentile.  Also wore a 2-week event monitor which showed a high burden of proximal atrial fibrillation at 9%.  When seen in the office on 9/19 it was recommended that he be started on Eliquis 5 mg twice daily along with metoprolol succinate 25 mg daily.  He was set up for  outpatient PET scan which was consistent with ischemia, intermediate risk.  As a result he was set up for outpatient cardiac catheterization.  Hospital Course     The patient underwent cardiac cath as noted above with severe heavily calcified mid LAD stenosis treated with PCI/DES x1, nonobstructive mild disease in dominant RCA and circumflex to be treated medically. Plan for triple therapy with ASA/Plavix/Eliquis for 1 month, then drop aspirin. The patient was seen by cardiac rehab while in short stay. There were no observed complications post cath. Radial cath site was re-evaluated prior to discharge and found to be stable without any complications. Instructions/precautions regarding cath site care were given prior to discharge.  Willia Craze was seen by Dr. Clifton James and determined stable for discharge home. Follow up with our office has been arranged. Medications are listed below. Pertinent changes include addition of ASA, plavix, protonix and NTG. _____________  Cath/PCI Registry Performance & Quality Measures: Aspirin prescribed? - Yes ADP Receptor Inhibitor (Plavix/Clopidogrel, Brilinta/Ticagrelor or Effient/Prasugrel) prescribed (includes medically managed patients)? - Yes High Intensity Statin (Lipitor 40-80mg  or Crestor 20-40mg ) prescribed? - Yes For EF <40%, was ACEI/ARB prescribed? - Not Applicable (EF >/= 40%) For EF <40%, Aldosterone Antagonist (Spironolactone or Eplerenone) prescribed? - Not Applicable (EF >/= 40%) Cardiac Rehab Phase II ordered (Included Medically managed Patients)? - Yes _____________   Discharge Vitals Blood pressure 121/81, pulse (!) 54, temperature 97.6 F (36.4 C), temperature source Oral, resp. rate 18, height 6'  2" (1.88 m), weight 87.5 kg, SpO2 97%.  Filed Weights   03/10/23 0500 03/10/23 0545  Weight: 89.5 kg 87.5 kg    Last Labs & Radiologic Studies    CBC No results for input(s): "WBC", "NEUTROABS", "HGB", "HCT", "MCV", "PLT" in the last 72  hours. Basic Metabolic Panel No results for input(s): "NA", "K", "CL", "CO2", "GLUCOSE", "BUN", "CREATININE", "CALCIUM", "MG", "PHOS" in the last 72 hours. Liver Function Tests No results for input(s): "AST", "ALT", "ALKPHOS", "BILITOT", "PROT", "ALBUMIN" in the last 72 hours. No results for input(s): "LIPASE", "AMYLASE" in the last 72 hours. High Sensitivity Troponin:   Recent Labs  Lab 02/09/23 1922  TROPONINIHS 4    BNP Invalid input(s): "POCBNP" D-Dimer No results for input(s): "DDIMER" in the last 72 hours. Hemoglobin A1C No results for input(s): "HGBA1C" in the last 72 hours. Fasting Lipid Panel No results for input(s): "CHOL", "HDL", "LDLCALC", "TRIG", "CHOLHDL", "LDLDIRECT" in the last 72 hours. Thyroid Function Tests No results for input(s): "TSH", "T4TOTAL", "T3FREE", "THYROIDAB" in the last 72 hours.  Invalid input(s): "FREET3" _____________  CARDIAC CATHETERIZATION  Result Date: 03/10/2023   Prox RCA lesion is 30% stenosed.   Mid RCA lesion is 30% stenosed.   Dist RCA lesion is 30% stenosed.   Ost Cx to Prox Cx lesion is 40% stenosed.   Mid Cx to Dist Cx lesion is 40% stenosed.   Dist LAD lesion is 20% stenosed.   Prox LAD to Mid LAD lesion is 80% stenosed.   A drug-eluting stent was successfully placed using a SYNERGY XD 2.50X20.   Post intervention, there is a 0% residual stenosis. Severe, heavily calcified mid LAD stenosis Successful PTCA/Intravascular Lithotripsy/DES x 1 mid LAD Mild non-obstructive disease in the large, dominant RCA and in the Circumflex Recommendations: Will plan same day post PCI discharge. Will use DAPT with ASA/Plavix for one month then stop ASA. Resume Eliquis tomorrow. Anticipate 12 months of Plavix therapy.   ECHOCARDIOGRAM COMPLETE  Result Date: 03/07/2023    ECHOCARDIOGRAM REPORT   Patient Name:   Andrew [name]   Date of Exam: 03/07/2023 Medical Rec #:  [id number]     Height:       74.0 in Accession #:    [id number]    Weight:       197.4 lb Date  of Birth:  06/16/1955     BSA:          2.161 m Patient Age:    67 years      BP:           113/62 mmHg Patient Gender: M             HR:           65 bpm. Exam Location:  [address] Procedure: 2D Echo, Color Doppler, Cardiac Doppler, 3D Echo and Strain Analysis Indications:    Atrial Fibrillation I48.0  History:        Patient has no prior history of Echocardiogram examinations.  Sonographer:    Thurman Coyer RDCS Referring Phys: [phone] MIHAI CROITORU IMPRESSIONS  1. Left ventricular ejection fraction, by estimation, is 60 to 65%. Left ventricular ejection fraction by 3D volume is 59 %. The left ventricle has normal function. The left ventricle has no regional wall motion abnormalities. Left ventricular diastolic  parameters were normal.  2. Right ventricular systolic function is normal. The right ventricular size is normal. There is normal pulmonary artery systolic pressure.  3. The mitral valve is normal in  structure. No evidence of mitral valve regurgitation.  4. The aortic valve is normal in structure. Aortic valve regurgitation is mild.  5. Aortic dilatation noted. There is mild dilatation of the aortic root, measuring 40 mm. FINDINGS  Left Ventricle: Left ventricular ejection fraction, by estimation, is 60 to 65%. Left ventricular ejection fraction by 3D volume is 59 %. The left ventricle has normal function. The left ventricle has no regional wall motion abnormalities. The left ventricular internal cavity size was normal in size. There is no left ventricular hypertrophy. Left ventricular diastolic parameters were normal. Right Ventricle: The right ventricular size is normal. No increase in right ventricular wall thickness. Right ventricular systolic function is normal. There is normal pulmonary artery systolic pressure. The tricuspid regurgitant velocity is 2.05 m/s, and  with an assumed right atrial pressure of 3 mmHg, the estimated right ventricular systolic pressure is 19.8 mmHg. Left Atrium: Left  atrial size was normal in size. Right Atrium: Right atrial size was normal in size. Pericardium: There is no evidence of pericardial effusion. Mitral Valve: The mitral valve is normal in structure. No evidence of mitral valve regurgitation. Tricuspid Valve: The tricuspid valve is normal in structure. Tricuspid valve regurgitation is mild. Aortic Valve: The aortic valve is normal in structure. Aortic valve regurgitation is mild. Pulmonic Valve: The pulmonic valve was grossly normal. Pulmonic valve regurgitation is not visualized. Aorta: Aortic dilatation noted. There is mild dilatation of the aortic root, measuring 40 mm. IAS/Shunts: The atrial septum is grossly normal.  LEFT VENTRICLE PLAX 2D LVIDd:         4.80 cm         Diastology LVIDs:         3.00 cm         LV e' medial:    9.14 cm/s LV PW:         0.80 cm         LV E/e' medial:  7.9 LV IVS:        0.90 cm         LV e' lateral:   12.30 cm/s LVOT diam:     2.50 cm         LV E/e' lateral: 5.9 LV SV:         114 LV SV Index:   53 LVOT Area:     4.91 cm        3D Volume EF                                LV 3D EF:    Left                                             ventricul                                             ar                                             ejection  fraction                                             by 3D                                             volume is                                             59 %.                                 3D Volume EF:                                3D EF:        59 %                                LV EDV:       144 ml                                LV ESV:       59 ml                                LV SV:        85 ml RIGHT VENTRICLE RV Basal diam:  4.10 cm RV Mid diam:    3.90 cm RV S prime:     13.30 cm/s TAPSE (M-mode): 2.2 cm LEFT ATRIUM             Index        RIGHT ATRIUM           Index LA diam:        3.90 cm 1.80 cm/m   RA Area:     21.00 cm LA  Vol (A2C):   60.8 ml 28.13 ml/m  RA Volume:   66.40 ml  30.72 ml/m LA Vol (A4C):   37.2 ml 17.21 ml/m LA Biplane Vol: 49.4 ml 22.86 ml/m  AORTIC VALVE LVOT Vmax:   102.00 cm/s LVOT Vmean:  69.200 cm/s LVOT VTI:    0.233 m  AORTA Ao Root diam: 4.00 cm Ao Asc diam:  3.90 cm MITRAL VALVE               TRICUSPID VALVE MV Area (PHT): 3.91 cm    TR Peak grad:   16.8 mmHg MV Decel Time: 194 msec    TR Vmax:        205.00 cm/s MV E velocity: 72.30 cm/s MV A velocity: 59.70 cm/s  SHUNTS MV E/A ratio:  1.21        Systemic VTI:  0.23 m  Systemic Diam: 2.50 cm Clearnce Hasten Electronically signed by Clearnce Hasten Signature Date/Time: 03/07/2023/9:03:58 AM    Final    NM PET CT CARDIAC PERFUSION MULTI W/ABSOLUTE BLOODFLOW  Result Date: 02/23/2023   Anterior ischemia on perfusion images.  While myocardial blood flow reserve is normal both globally and in each coronary distribution, it is mildly reduced (1.88) in region matching the perfusion defect.  There are no high risk findings such as drop in EF with stress or TID.  Overall, study suggests LAD territory ischemia and is intermediate risk   LV perfusion is abnormal. There is evidence of ischemia. Defect 1: There is a medium defect with moderate reduction in uptake present in the apical to basal anterior and apex location(s) that is partially reversible. There is normal wall motion in the defect area. Consistent with ischemia. The defect is consistent with abnormal perfusion in the LAD territory.   Rest left ventricular function is abnormal. Rest global function is mildly reduced. Rest EF: 45%. Stress left ventricular function is normal. Stress EF: 55%. End diastolic cavity size is normal. End systolic cavity size is normal.   Myocardial blood flow was computed to be 0.21ml/g/min at rest and 2.13ml/g/min at stress. Global myocardial blood flow reserve was 2.43 and was normal.   Coronary calcium was present on the attenuation correction CT  images. Severe coronary calcifications were present. Coronary calcifications were present in the left anterior descending artery, left circumflex artery and right coronary artery distribution(s).   Findings are consistent with ischemia. The study is intermediate risk.   Electronically signed by Epifanio Lesches, MD EXAM: OVER-READ INTERPRETATION  PET-CT CHEST The following report is an over-read performed by radiologist Dr. Leatha Gilding [facility] on 02/22/2023. This over-read does not include interpretation of cardiac or coronary anatomy or pathology. The cardiac PET and cardiac CT interpretation by the cardiologist is to be attached. COMPARISON:  None. FINDINGS: No evidence for lymphadenopathy within the visualized mediastinum or hilar regions. Small hiatal hernia. 4 mm right lung nodule seen on image 20/4. Several additional tiny pulmonary nodules identified including left upper lobe on 11/04, left lower lobe on 29/4, and anterior right lung on 35/4. Scattered tiny hypodensities in the liver parenchyma are too small to characterize but are statistically most likely benign. No followup imaging is recommended. No worrisome lytic or sclerotic osseous abnormality. IMPRESSION: 1. Several tiny bilateral pulmonary nodules measuring up to 4 mm. No follow-up needed if patient is low-risk.This recommendation follows the consensus statement: Guidelines for Management of Incidental Pulmonary Nodules Detected on CT Images: From the Fleischner Society 2017; Radiology 2017; 284:228-243. 2. Small hiatal hernia. Electronically Signed   By: Kennith Center M.D.   On: 02/22/2023 11:44  DG Chest 2 View  Result Date: 02/09/2023 CLINICAL DATA:  Chest pain EXAM: CHEST - 2 VIEW COMPARISON:  Chest x-ray 07/13/2006 FINDINGS: The heart size and mediastinal contours are within normal limits. Both lungs are clear. There is mild compression deformity of a midthoracic vertebral body new from 2008. IMPRESSION: 1. No active  cardiopulmonary disease. 2. Mild compression deformity of a midthoracic vertebral body new from 2008. Electronically Signed   By: Darliss Cheney M.D.   On: 02/09/2023 20:54    Disposition   Pt is being discharged home today in good condition.  Follow-up Plans & Appointments     Discharge Instructions     Amb Referral to Cardiac Rehabilitation   Complete by: As directed    Diagnosis:  Coronary Stents PTCA  After initial evaluation and assessments completed: Virtual Based Care may be provided alone or in conjunction with Phase 2 Cardiac Rehab based on patient barriers.: Yes   Intensive Cardiac Rehabilitation (ICR) [facility] location only OR Traditional Cardiac Rehabilitation (TCR) *If criteria for ICR are not met will enroll in TCR [facility] only): Yes        Discharge Medications   Allergies as of 03/10/2023       Reactions   Other Anaphylaxis   Watermelon - Anaphylactic  Bananas - Inside of mouth itch   Erythromycin Itching        Medication List     STOP taking these medications    ibuprofen 200 MG tablet Commonly known as: ADVIL   omeprazole 40 MG capsule Commonly known as: PRILOSEC       TAKE these medications    amoxicillin 500 MG tablet Commonly known as: AMOXIL Take 2,000 mg by mouth See admin instructions. Take 1 hour prior to dental procedures   apixaban 5 MG Tabs tablet Commonly known as: ELIQUIS Take 1 tablet (5 mg total) by mouth 2 (two) times daily. Start taking on: March 11, 2023   ascorbic acid 1000 MG tablet Commonly known as: VITAMIN C Take 1,000 mg by mouth 2 (two) times daily.   aspirin EC 81 MG tablet Take 1 tablet (81 mg total) by mouth daily. Swallow whole.   atorvastatin 40 MG tablet Commonly known as: LIPITOR Take 40 mg by mouth daily.   B-12 2500 MCG Tabs Take 2,500 mcg by mouth daily.   cetirizine 10 MG tablet Commonly known as: ZYRTEC Take 10 mg by mouth daily as needed for allergies.   Claritin-D 12 Hour 5-120 MG  tablet Generic drug: loratadine-pseudoephedrine Take 1 tablet by mouth daily as needed for allergies.   clopidogrel 75 MG tablet Commonly known as: Plavix Take 1 tablet (75 mg total) by mouth daily.   CoQ10 100 MG Caps Take 100 mg by mouth in the morning and at bedtime.   doxycycline 100 MG tablet Commonly known as: ADOXA Take 100 mg by mouth daily as needed (Rosacea).   ezetimibe 10 MG tablet Commonly known as: ZETIA Take 1 tablet (10 mg total) by mouth daily.   FISH OIL PO Take 1,200 mg by mouth 2 (two) times daily.   fluticasone 50 MCG/ACT nasal spray Commonly known as: FLONASE Place 2 sprays into both nostrils daily as needed for allergies or rhinitis.   melatonin 5 MG Tabs Take 10 mg by mouth at bedtime.   metoprolol succinate 25 MG 24 hr tablet Commonly known as: Toprol XL Take 1 tablet (25 mg total) by mouth daily.   multivitamin with minerals tablet Take 1 tablet by mouth daily. Centrum for Men 50+   PRESERVISION AREDS 2+MULTI VIT PO Take 1 tablet by mouth in the morning and at bedtime.   nitroGLYCERIN 0.4 MG SL tablet Commonly known as: NITROSTAT Place 1 tablet (0.4 mg total) under the tongue every 5 (five) minutes as needed for chest pain.   pantoprazole 40 MG tablet Commonly known as: Protonix Take 1 tablet (40 mg total) by mouth every Monday, Wednesday, and Friday.   traZODone 50 MG tablet Commonly known as: DESYREL Take 50-100 mg by mouth at bedtime as needed for sleep.   tretinoin 0.1 % cream Commonly known as: RETIN-A Apply 1 Application topically daily as needed (rosacea).   Vitamin D3 125 MCG (5000 UT) Caps Take 5,000 Units by mouth daily.  Allergies Allergies  Allergen Reactions   Other Anaphylaxis    Watermelon - Anaphylactic  Bananas - Inside of mouth itch   Erythromycin Itching    Outstanding Labs/Studies   N/a   Duration of Discharge Encounter   Greater than 30 minutes including physician  time.  Signed, Laverda Page, NP 03/10/2023, 2:55 PM

## 2023-03-10 NOTE — Discharge Instructions (Signed)

## 2023-03-10 NOTE — Progress Notes (Signed)
Right radial TR band removed at 1215, gauze dressing applied. Right radial clean, dry, and intact. Patient walked to the bathroom without difficulties.

## 2023-03-13 ENCOUNTER — Telehealth (HOSPITAL_COMMUNITY): Payer: Self-pay

## 2023-03-13 ENCOUNTER — Encounter (HOSPITAL_COMMUNITY): Payer: Self-pay | Admitting: Cardiovascular Disease

## 2023-03-13 NOTE — Telephone Encounter (Signed)
Called patient to see if he is interested in the Cardiac Rehab Program. Patient expressed interest. Explained scheduling process and went over insurance, patient verbalized understanding. Will contact patient for scheduling once f/u has been completed.

## 2023-03-15 NOTE — Progress Notes (Signed)
Cardiology Office Note:  .   Date:  03/17/2023  ID:  Andrew [name], DOB 1956/04/28, MRN [id number] PCP: Emilio Aspen, MD  [facility] Providers Cardiologist:  Thurmon Fair, MD  History of Present Illness: Marland Kitchen   Andrew [name] is a 67 y.o. male with past medical history of dyslipidemia, paroxysmal atrial fibrillation, elevated coronary calcium score/CAD.  Patient is followed by Dr. Royann Shivers and presents today for follow-up after same-day PCI.  Per chart review, patient first Dr. Salena Saner on 02/16/2023.  At that time, patient reported having problems with chest discomfort and palpitations.  He had recently undergone a coronary calcium score that was markedly elevated at 1920 (95th percentile).  He had also 1 to 2-week event monitor that showed paroxysmal atrial fibrillation with a 9% burden.  Longest episode of A-fib only lasted 1 hour and 43 minutes in duration.  He was started on Eliquis 5 mg twice daily and metoprolol succinate.  He underwent nuclear PET on 02/22/2023 that showed anterior ischemia on perfusion images.  There were no high risk findings such as drop in EP with stress or 3 times daily.  Overall, study suggested LAD territory ischemia and was an intermediate risk study.  He underwent echocardiogram on 03/07/2023 that showed EF 60-65%, no regional wall motion abnormalities, normal RV function, normal pulmonary artery systolic pressure, mild dilation of the aortic root measuring 40 mm.  He underwent outpatient cardiac catheterization on 03/10/2023 and was found to have severe, heavily calcified mid LAD stenosis that was treated with PTCA/intravascular lithotripsy/DES x 1 to the mid LAD.  There was otherwise mild, nonobstructive disease in the large RCA and in the circumflex.  Recommended dual antiplatelet therapy with aspirin, Plavix for 1 month, then stop aspirin and remain on Eliquis and Plavix.  Today, patient presents for follow up after PCI. Reports that his afib diagnosis was  initially suggested by his Fitbit, which indicated episodes of AFib during the night. Despite these readings, the patient reports no noticeable symptoms or discomfort associated with AFib. The patient underwent a Zio monitor test, which showed no AFib at night but episodes of AFib during the day.  The patient recently had a stent placed, following the discovery of an 80% blockage. Post-procedure, the patient reports no change in his condition or energy levels. The patient has noticed that his heart rate decreases to the 50s when lying down, a change attributed to the introduction of toprolol to manage the AFib heart rate. The patient also reports a history of costochondritis, which he manages with ibuprofen. However, he has been advised to avoid ibuprofen due to its interaction with the three different blood thinners he is currently taking post-stent placement.  The patient has a high pain tolerance and reports no pain or discomfort at the catheter insertion site in the wrist, despite some residual swelling.  The patient maintains an active lifestyle, often working in the yard and achieving high step counts. He reports no issues with dizziness or other side effects from his medications. The patient also uses a CPAP machine for sleep apnea, a condition known to be associated with AFib.   ROS: Denies chest pain, shortness of breath, fatigue, palpitations, syncope, near syncope, dizziness.   Studies Reviewed: .   Cardiac Studies & Procedures   CARDIAC CATHETERIZATION  CARDIAC CATHETERIZATION 03/10/2023  Narrative   Prox RCA lesion is 30% stenosed.   Mid RCA lesion is 30% stenosed.   Dist RCA lesion is 30% stenosed.   Suezanne Jacquet  Cx to Prox Cx lesion is 40% stenosed.   Mid Cx to Dist Cx lesion is 40% stenosed.   Dist LAD lesion is 20% stenosed.   Prox LAD to Mid LAD lesion is 80% stenosed.   A drug-eluting stent was successfully placed using a SYNERGY XD 2.50X20.   Post intervention, there is a 0%  residual stenosis.  Severe, heavily calcified mid LAD stenosis Successful PTCA/Intravascular Lithotripsy/DES x 1 mid LAD Mild non-obstructive disease in the large, dominant RCA and in the Circumflex  Recommendations: Will plan same day post PCI discharge. Will use DAPT with ASA/Plavix for one month then stop ASA. Resume Eliquis tomorrow. Anticipate 12 months of Plavix therapy.  Findings Coronary Findings Diagnostic  Dominance: Right  Left Anterior Descending Vessel is large. Prox LAD to Mid LAD lesion is 80% stenosed. The lesion is calcified. Dist LAD lesion is 20% stenosed.  Left Circumflex Ost Cx to Prox Cx lesion is 40% stenosed. Mid Cx to Dist Cx lesion is 40% stenosed.  Right Coronary Artery Vessel is large. Prox RCA lesion is 30% stenosed. Mid RCA lesion is 30% stenosed. Dist RCA lesion is 30% stenosed.  Intervention  Prox LAD to Mid LAD lesion Stent CATH VISTA GUIDE 6FR XBLAD3.5 guide catheter was inserted. Lesion crossed with guidewire using a WIRE ASAHI PROWATER 180CM. Pre-stent angioplasty was performed using a BALLN EMERGE MR 2.0X15. A drug-eluting stent was successfully placed using a SYNERGY XD 2.50X20. Stent strut is well apposed. Post-stent angioplasty was performed using a BALLN Tanaina EUPHORA RX [id number]. Post-Intervention Lesion Assessment The intervention was successful. Pre-interventional TIMI flow is 3. Post-intervention TIMI flow is 3. No complications occurred at this lesion. There is a 0% residual stenosis post intervention.   STRESS TESTS  NM PET CT CARDIAC PERFUSION MULTI W/ABSOLUTE BLOODFLOW 02/22/2023  Narrative   Anterior ischemia on perfusion images.  While myocardial blood flow reserve is normal both globally and in each coronary distribution, it is mildly reduced (1.88) in region matching the perfusion defect.  There are no high risk findings such as drop in EF with stress or TID.  Overall, study suggests LAD territory ischemia and is intermediate  risk   LV perfusion is abnormal. There is evidence of ischemia. Defect 1: There is a medium defect with moderate reduction in uptake present in the apical to basal anterior and apex location(s) that is partially reversible. There is normal wall motion in the defect area. Consistent with ischemia. The defect is consistent with abnormal perfusion in the LAD territory.   Rest left ventricular function is abnormal. Rest global function is mildly reduced. Rest EF: 45%. Stress left ventricular function is normal. Stress EF: 55%. End diastolic cavity size is normal. End systolic cavity size is normal.   Myocardial blood flow was computed to be 0.75ml/g/min at rest and 2.51ml/g/min at stress. Global myocardial blood flow reserve was 2.43 and was normal.   Coronary calcium was present on the attenuation correction CT images. Severe coronary calcifications were present. Coronary calcifications were present in the left anterior descending artery, left circumflex artery and right coronary artery distribution(s).   Findings are consistent with ischemia. The study is intermediate risk.   Electronically signed by Epifanio Lesches, MD  EXAM: OVER-READ INTERPRETATION  PET-CT CHEST  The following report is an over-read performed by radiologist Dr. Leatha Gilding [facility] on 02/22/2023. This over-read does not include interpretation of cardiac or coronary anatomy or pathology. The cardiac PET and cardiac CT interpretation by the cardiologist is to  be attached.  COMPARISON:  None.  FINDINGS: No evidence for lymphadenopathy within the visualized mediastinum or hilar regions. Small hiatal hernia.  4 mm right lung nodule seen on image 20/4. Several additional tiny pulmonary nodules identified including left upper lobe on 11/04, left lower lobe on 29/4, and anterior right lung on 35/4.  Scattered tiny hypodensities in the liver parenchyma are too small to characterize but are statistically  most likely benign. No followup imaging is recommended.  No worrisome lytic or sclerotic osseous abnormality.  IMPRESSION: 1. Several tiny bilateral pulmonary nodules measuring up to 4 mm. No follow-up needed if patient is low-risk.This recommendation follows the consensus statement: Guidelines for Management of Incidental Pulmonary Nodules Detected on CT Images: From the Fleischner Society 2017; Radiology 2017; 284:228-243. 2. Small hiatal hernia.   Electronically Signed By: Kennith Center M.D. On: 02/22/2023 11:44   ECHOCARDIOGRAM  ECHOCARDIOGRAM COMPLETE 03/07/2023  Narrative ECHOCARDIOGRAM REPORT    Patient Name:   TIGE COSTILOW   Date of Exam: 03/07/2023 Medical Rec #:  [id number]     Height:       74.0 in Accession #:    [id number]    Weight:       197.4 lb Date of Birth:  1955/10/21     BSA:          2.161 m Patient Age:    67 years      BP:           113/62 mmHg Patient Gender: M             HR:           65 bpm. Exam Location:  [address]  Procedure: 2D Echo, Color Doppler, Cardiac Doppler, 3D Echo and Strain Analysis  Indications:    Atrial Fibrillation I48.0  History:        Patient has no prior history of Echocardiogram examinations.  Sonographer:    Thurman Coyer RDCS Referring Phys: [phone] MIHAI CROITORU  IMPRESSIONS   1. Left ventricular ejection fraction, by estimation, is 60 to 65%. Left ventricular ejection fraction by 3D volume is 59 %. The left ventricle has normal function. The left ventricle has no regional wall motion abnormalities. Left ventricular diastolic parameters were normal. 2. Right ventricular systolic function is normal. The right ventricular size is normal. There is normal pulmonary artery systolic pressure. 3. The mitral valve is normal in structure. No evidence of mitral valve regurgitation. 4. The aortic valve is normal in structure. Aortic valve regurgitation is mild. 5. Aortic dilatation noted. There is mild dilatation of the  aortic root, measuring 40 mm.  FINDINGS Left Ventricle: Left ventricular ejection fraction, by estimation, is 60 to 65%. Left ventricular ejection fraction by 3D volume is 59 %. The left ventricle has normal function. The left ventricle has no regional wall motion abnormalities. The left ventricular internal cavity size was normal in size. There is no left ventricular hypertrophy. Left ventricular diastolic parameters were normal.  Right Ventricle: The right ventricular size is normal. No increase in right ventricular wall thickness. Right ventricular systolic function is normal. There is normal pulmonary artery systolic pressure. The tricuspid regurgitant velocity is 2.05 m/s, and with an assumed right atrial pressure of 3 mmHg, the estimated right ventricular systolic pressure is 19.8 mmHg.  Left Atrium: Left atrial size was normal in size.  Right Atrium: Right atrial size was normal in size.  Pericardium: There is no evidence of pericardial effusion.  Mitral Valve: The mitral  valve is normal in structure. No evidence of mitral valve regurgitation.  Tricuspid Valve: The tricuspid valve is normal in structure. Tricuspid valve regurgitation is mild.  Aortic Valve: The aortic valve is normal in structure. Aortic valve regurgitation is mild.  Pulmonic Valve: The pulmonic valve was grossly normal. Pulmonic valve regurgitation is not visualized.  Aorta: Aortic dilatation noted. There is mild dilatation of the aortic root, measuring 40 mm.  IAS/Shunts: The atrial septum is grossly normal.   LEFT VENTRICLE PLAX 2D LVIDd:         4.80 cm         Diastology LVIDs:         3.00 cm         LV e' medial:    9.14 cm/s LV PW:         0.80 cm         LV E/e' medial:  7.9 LV IVS:        0.90 cm         LV e' lateral:   12.30 cm/s LVOT diam:     2.50 cm         LV E/e' lateral: 5.9 LV SV:         114 LV SV Index:   53 LVOT Area:     4.91 cm        3D Volume EF LV 3D EF:     Left ventricul ar ejection fraction by 3D volume is 59 %.  3D Volume EF: 3D EF:        59 % LV EDV:       144 ml LV ESV:       59 ml LV SV:        85 ml  RIGHT VENTRICLE RV Basal diam:  4.10 cm RV Mid diam:    3.90 cm RV S prime:     13.30 cm/s TAPSE (M-mode): 2.2 cm  LEFT ATRIUM             Index        RIGHT ATRIUM           Index LA diam:        3.90 cm 1.80 cm/m   RA Area:     21.00 cm LA Vol (A2C):   60.8 ml 28.13 ml/m  RA Volume:   66.40 ml  30.72 ml/m LA Vol (A4C):   37.2 ml 17.21 ml/m LA Biplane Vol: 49.4 ml 22.86 ml/m AORTIC VALVE LVOT Vmax:   102.00 cm/s LVOT Vmean:  69.200 cm/s LVOT VTI:    0.233 m  AORTA Ao Root diam: 4.00 cm Ao Asc diam:  3.90 cm  MITRAL VALVE               TRICUSPID VALVE MV Area (PHT): 3.91 cm    TR Peak grad:   16.8 mmHg MV Decel Time: 194 msec    TR Vmax:        205.00 cm/s MV E velocity: 72.30 cm/s MV A velocity: 59.70 cm/s  SHUNTS MV E/A ratio:  1.21        Systemic VTI:  0.23 m Systemic Diam: 2.50 cm  Clearnce Hasten Electronically signed by Clearnce Hasten Signature Date/Time: 03/07/2023/9:03:58 AM    Final     CT SCANS  CT CARDIAC SCORING (SELF PAY ONLY) 01/27/2023  Addendum 02/07/2023  8:53 AM ADDENDUM REPORT: 02/07/2023 08:50  EXAM: OVER-READ INTERPRETATION  CT CHEST  The following report is an over-read  performed by radiologist Dr. Curly Shores [facility] on 02/07/2023. This over-read does not include interpretation of cardiac or coronary anatomy or pathology. The coronary CTA interpretation by the cardiologist is attached.  COMPARISON:  None.  FINDINGS: Cardiovascular: Atheromatous calcifications of aorta and coronary arteries. See findings discussed in the body of the report.  Mediastinum/Nodes: No suspicious adenopathy identified. Imaged mediastinal structures are unremarkable.  Lungs/Pleura: Imaged lungs are clear. No pleural effusion or pneumothorax.  Upper Abdomen:  Subcentimeter hepatic hypodensities consistent with cysts. No acute abnormality.  Musculoskeletal: No chest wall abnormality. There are thoracic degenerative changes.  IMPRESSION: Subcentimeter hypodensities in the liver consistent with cysts. Otherwise no significant extracardiac incidental findings identified.   Electronically Signed By: Layla Maw M.D. On: 02/07/2023 08:50  Narrative CLINICAL DATA:  Cardiovascular disease risk stratification  EXAM: CT Coronary Calcium Score  TECHNIQUE: A gated, non-contrast computed tomography scan of the heart was performed using 3mm slice thickness. Axial images were analyzed on a dedicated workstation. Calcium scoring of the coronary arteries was performed using the Agatston method.  FINDINGS: Coronary Calcium Score:  Left main: 74  Left anterior descending artery: 850  Left circumflex artery: 380  Right coronary artery: 616  Total: 1920  Percentile: 95th  Pericardium: Normal.  Ascending Aorta: Upper limit of normal caliber. Ascending aorta measures approximately 38mm at the mid ascending aorta measured in a non-contrast axial plane.  Non-cardiac: See separate report from [facility].  IMPRESSION: Coronary calcium score of 1920. This was 95th percentile for age-, race-, and sex-matched controls.  RECOMMENDATIONS: Coronary artery calcium (CAC) score is a strong predictor of incident coronary heart disease (CHD) and provides predictive information beyond traditional risk factors. CAC scoring is reasonable to use in the decision to withhold, postpone, or initiate statin therapy in intermediate-risk or selected borderline-risk asymptomatic adults (age 80-75 years and LDL-C >=70 to <190 mg/dL) who do not have diabetes or established atherosclerotic cardiovascular disease (ASCVD).* In intermediate-risk (10-year ASCVD risk >=7.5% to <20%) adults or selected borderline-risk (10-year ASCVD risk >=5% to  <7.5%) adults in whom a CAC score is measured for the purpose of making a treatment decision the following recommendations have been made:  If CAC=0, it is reasonable to withhold statin therapy and reassess in 5 to 10 years, as long as higher risk conditions are absent (diabetes mellitus, family history of premature CHD in first degree relatives (males <55 years; females <65 years), cigarette smoking, or LDL >=190 mg/dL).  If CAC is 1 to 99, it is reasonable to initiate statin therapy for patients >=1 years of age.  If CAC is >=100 or >=75th percentile, it is reasonable to initiate statin therapy at any age.  Cardiology referral should be considered for patients with CAC scores >=400 or >=75th percentile.  *2018 AHA/ACC/AACVPR/AAPA/ABC/ACPM/ADA/AGS/APhA/ASPC/NLA/PCNA Guideline on the Management of Blood Cholesterol: A Report of the American College of Cardiology/American Heart Association Task Force on Clinical Practice Guidelines. J Am Coll Cardiol. 2019;73(24):3168-3209.  Electronically Signed: By: Weston Brass M.D. On: 01/27/2023 23:29          Risk Assessment/Calculations:    CHA2DS2-VASc Score = 2   This indicates a 2.2% annual risk of stroke. The patient's score is based upon: CHF History: 0 HTN History: 0 Diabetes History: 0 Stroke History: 0 Vascular Disease History: 1 Age Score: 1 Gender Score: 0    Physical Exam:   VS:  BP 106/72   Pulse 66   Ht 6\' 2"  (1.88 m)   Wt 196  lb (88.9 kg)   SpO2 98%   BMI 25.16 kg/m    Wt Readings from Last 3 Encounters:  03/17/23 196 lb (88.9 kg)  03/10/23 193 lb (87.5 kg)  02/16/23 197 lb 6.4 oz (89.5 kg)    GEN: Well nourished, well developed in no acute distress. Sitting comfortably on the exam table NECK: No JVD; No carotid bruits CARDIAC: RRR, no murmurs, rubs, gallops. Radial pulses 2+ bilaterally  RESPIRATORY:  Clear to auscultation without rales, wheezing or rhonchi. Normal work of breathing on room air   ABDOMEN: Soft, non-tender, non-distended EXTREMITIES:  No edema in BLE; No deformity   ASSESSMENT AND PLAN: .   CAD -Underwent left heart catheterization on 10/11 and was found to have severe, heavily calcified mid LAD stenosis that was treated with PTCA/intravascular lithotripsy/DES x 1.  There was otherwise mild, nonobstructive disease in the RCA and circumflex - He denies chest pain, shortness of breath since the procedure  - Right radial cath site examined- he does have a small, hard area of swelling under the cath site. Area is nontender, no bruit present. Unlikely to be AV fistula due to lack of bruit. Unlikely to be pseudoaneurysm as site is nontender. Suspect hematoma, instructed patient to monitor the site and to call us if he develops worsening bruising, swelling, pain, or bleeding  -Continue aspirin, Plavix, Eliquis for 1 month.  Then, stop aspirin and continue Eliquis and Plavix. Patient denies bleeding  -Continue metoprolol -Continue Lipitor  Paroxysmal atrial fibrillation -Previous 2-week monitor showed high burden of paroxysmal atrial fibrillation (9%).  Tended to be more common during daytime hours.  Some of the episodes were brief bursts of ectopic atrial tachycardia, with a pattern suggesting pulmonary vein tachycardia.  He also had frequent premature supraventricular contractions (15% burden) -Currently on metoprolol succinate 25 mg once daily. Patient denies any symptoms when in afib  -Per Dr. 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  Possible that patient may be a good candidate for an early approach with ablation. Patient has an appointment with Dr. Royann Shivers in 05/2023 to discuss further  -Continue Eliquis 5 mg twice daily- no Bleeding -Discussed A-fib triggers including alcohol, untreated sleep apnea, caffeine.  Patient does have OSA and has been compliant with CPAP   Hyperlipidemia -LDL goal less than 70 - LDL 91 in 11/2022 -Continue Lipitor 40 mg  daily, Zetia 10 mg daily  OSA - Encouraged compliance with CPAP       Dispo: Follow up in 3 months with Dr. Royann Shivers   Signed, Jonita Albee, PA-C

## 2023-03-17 ENCOUNTER — Encounter: Payer: Self-pay | Admitting: Cardiology

## 2023-03-17 ENCOUNTER — Ambulatory Visit: Payer: PPO | Attending: Cardiology | Admitting: Cardiology

## 2023-03-17 VITALS — BP 106/72 | HR 66 | Ht 74.0 in | Wt 196.0 lb

## 2023-03-17 DIAGNOSIS — G4733 Obstructive sleep apnea (adult) (pediatric): Secondary | ICD-10-CM

## 2023-03-17 DIAGNOSIS — E785 Hyperlipidemia, unspecified: Secondary | ICD-10-CM | POA: Diagnosis not present

## 2023-03-17 DIAGNOSIS — Z955 Presence of coronary angioplasty implant and graft: Secondary | ICD-10-CM

## 2023-03-17 DIAGNOSIS — I2510 Atherosclerotic heart disease of native coronary artery without angina pectoris: Secondary | ICD-10-CM | POA: Diagnosis not present

## 2023-03-17 DIAGNOSIS — I480 Paroxysmal atrial fibrillation: Secondary | ICD-10-CM | POA: Diagnosis not present

## 2023-03-17 NOTE — Patient Instructions (Signed)
Medication Instructions:  No changes *If you need a refill on your cardiac medications before your next appointment, please call your pharmacy*   Lab Work: No labs   Testing/Procedures: No testing   Follow-Up: At [facility], you and your health needs are our priority.  As part of our continuing mission to provide you with exceptional heart care, we have created designated Provider Care Teams.  These Care Teams include your primary Cardiologist (physician) and Advanced Practice Providers (APPs -  Physician Assistants and Nurse Practitioners) who all work together to provide you with the care you need, when you need it.  We recommend signing up for the patient portal called "MyChart".  Sign up information is provided on this After Visit Summary.  MyChart is used to connect with patients for Virtual Visits (Telemedicine).  Patients are able to view lab/test results, encounter notes, upcoming appointments, etc.  Non-urgent messages can be sent to your provider as well.   To learn more about what you can do with MyChart, go to [email or web address].    Your next appointment:   January 17th at 8:40 AM with Thurmon Fair, MD

## 2023-03-22 ENCOUNTER — Encounter (HOSPITAL_COMMUNITY): Payer: Self-pay

## 2023-03-22 ENCOUNTER — Telehealth (HOSPITAL_COMMUNITY): Payer: Self-pay

## 2023-03-22 NOTE — Telephone Encounter (Signed)
Called Pt to schedule cardiac rehab. Pt is interested but stated " I can not schedule at this time due to being told by the last person I talked to that I had to commit to 2-3 days a week and not knowing which 2 I need. 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 Once things clear off my schedule I will call to schedule orientation." I did explain to him that is fine to give [place] a call when ready and we will get him scheduled!

## 2023-03-23 ENCOUNTER — Encounter (HOSPITAL_COMMUNITY): Payer: Self-pay

## 2023-04-14 ENCOUNTER — Other Ambulatory Visit: Payer: Self-pay | Admitting: Emergency Medicine

## 2023-04-14 DIAGNOSIS — I480 Paroxysmal atrial fibrillation: Secondary | ICD-10-CM

## 2023-04-14 NOTE — Progress Notes (Signed)
Croitoru, Rachelle Hora, MD  Scheryl Marten, RN Can we please see what happened to his EP referral for AFib? He has not heard from them yet.  (Did not see one placed, placing new referral to EP for Afib)

## 2023-06-06 ENCOUNTER — Other Ambulatory Visit (HOSPITAL_COMMUNITY): Payer: Self-pay

## 2023-06-12 ENCOUNTER — Ambulatory Visit: Payer: PPO | Attending: Cardiovascular Disease | Admitting: Cardiovascular Disease

## 2023-06-12 ENCOUNTER — Encounter: Payer: Self-pay | Admitting: Cardiovascular Disease

## 2023-06-12 VITALS — BP 126/80 | HR 63 | Ht 74.0 in | Wt 201.0 lb

## 2023-06-12 DIAGNOSIS — I480 Paroxysmal atrial fibrillation: Secondary | ICD-10-CM

## 2023-06-12 DIAGNOSIS — R931 Abnormal findings on diagnostic imaging of heart and coronary circulation: Secondary | ICD-10-CM | POA: Diagnosis not present

## 2023-06-12 LAB — LIPID PANEL
Chol/HDL Ratio: 3 {ratio} (ref 0.0–5.0)
Cholesterol, Total: 129 mg/dL (ref 100–199)
HDL: 43 mg/dL (ref >39)
LDL Chol Calc (NIH): 67 mg/dL (ref 0–99)
Triglycerides: 102 mg/dL (ref 0–149)
VLDL Cholesterol Cal: 19 mg/dL (ref 5–40)

## 2023-06-12 NOTE — Progress Notes (Signed)
 Electrophysiology Office Note:    Date:  06/12/2023   ID:  Andrew [name], DOB 1956/02/19, MRN [id number]  PCP:  Charlott Dorn LABOR, MD   [facility] Providers Cardiologist:  Jerel Balding, MD     Referring MD: Balding Jerel, MD   History of Present Illness:    Andrew [name] is a 68 y.o. male with a medical history significant for PAF, CAD s/p PCI 2024 referred for AF management.     AF was initially detected by his FitBit, with which he was having nocturnal episodes of AF.  His primary care physician placed a monitor that showed daytime AF episodes.  Results of the monitor are detailed below.  I discussed the use of AI scribe software for clinical note transcription with the patient, who gave verbal consent to proceed.   They report that their Fitbit frequently detects episodes of AFib at night, but not during the day. The patient is currently on metoprolol  and Eliquis  for management of their condition. They also mention a feeling of fatigue after physical activity, but it is unclear if this is related to the AFib or another underlying condition. The patient has not reported any other symptoms related to AFib such as palpitations or shortness of breath. They have a history of coronary disease and are on daily aspirin  therapy. The patient also reports a pain in their chest, but it is unclear if this is related to their AFib or another condition.     Today, he reports that he feels well and is at baseline.  EKGs/Labs/Other Studies Reviewed Today:     Echocardiogram:  TTE 02/2023 EF 60-65%. Normal biatrial size   Monitors:  Zio monitor in media folder --my interpretation 9% burden of atrial fibrillation.  Occasional episodes of atrial tachycardia. Rates in AF not well-controlled, average ventricular rate 106 bpm with rates ranging to 220 bpm  Stress testing:   Advanced imaging:  Coronary CT calcium  score -- 01/2023 95th percentile for age  Cardiac  catherization  02/2023  Prox RCA lesion is 30% stenosed.   Mid RCA lesion is 30% stenosed.   Dist RCA lesion is 30% stenosed.   Ost Cx to Prox Cx lesion is 40% stenosed.   Mid Cx to Dist Cx lesion is 40% stenosed.   Dist LAD lesion is 20% stenosed.   Prox LAD to Mid LAD lesion is 80% stenosed.   A drug-eluting stent was successfully placed using a SYNERGY XD 2.50X20.   Post intervention, there is a 0% residual stenosis.  EKG:   EKG Interpretation Date/Time:  Monday June 12 2023 14:09:59 EST Ventricular Rate:  63 PR Interval:  178 QRS Duration:  110 QT Interval:  406 QTC Calculation: 415 R Axis:   77  Text Interpretation: Normal sinus rhythm Normal ECG When compared with ECG of 17-Mar-2023 14:28, Premature ventricular complexes are no longer Present QRS axis Shifted left Criteria for Lateral infarct are no longer Present T wave inversion no longer evident in Lateral leads Confirmed by Nancey Scotts [id number]) on 06/12/2023 2:13:36 PM     Physical Exam:    VS:  BP 126/80   Pulse 63   Ht 6' 2 (1.88 m)   Wt 201 lb (91.2 kg)   SpO2 98%   BMI 25.81 kg/m     Wt Readings from Last 3 Encounters:  06/12/23 201 lb (91.2 kg)  03/17/23 196 lb (88.9 kg)  03/10/23 193 lb (87.5 kg)     GEN: Well nourished,  well developed in no acute distress CARDIAC: RRR, no murmurs, rubs, gallops RESPIRATORY:  Normal work of breathing MUSCULOSKELETAL: NO edema    ASSESSMENT & PLAN:     Paroxysmal atrial fibrillation Minimal symptoms 9% burden of A-fib with moderately well-controlled rates I suspect the paucity of symptoms is due to to the short duration of his AF episodes Think he is a good candidate for rhythm control, and I recommended atrial fibrillation ablation to prevent disease progression and comorbidities.  He would like to proceed  We discussed the indication, rationale, logistics, anticipated benefits, and potential risks of the ablation procedure including but not limited to  -- bleed at the groin access site, chest pain, damage to nearby organs such as the diaphragm, lungs, or esophagus, need for a drainage tube, or prolonged hospitalization. I explained that the risk for stroke, heart attack, need for open chest surgery, or even death is very low but not zero. he  expressed understanding and wishes to proceed.   Secondary hypercoagulable state CHA2DS2-VASc score is 2 Continue apixaban  5 mg twice daily    Signed, Eulas FORBES Furbish, MD  06/12/2023 2:14 PM    [facility]

## 2023-06-12 NOTE — Patient Instructions (Addendum)
 Medication Instructions:  Your physician recommends that you continue on your current medications as directed. Please refer to the Current Medication list given to you today. *If you need a refill on your cardiac medications before your next appointment, please call your pharmacy*   Lab Work: CBC and BMET on Friday, Feb 21 - this can be completed at [facility] - no appointment required and this does not have to be fasting If you have labs (blood work) drawn today and your tests are completely normal, you will receive your results only by: MyChart Message (if you have MyChart) OR A paper copy in the mail If you have any lab test that is abnormal or we need to change your treatment, we will call you to review the results.   Testing/Procedures: Cardiac CT - you will get a call to schedule this Your physician has requested that you have cardiac CT. Cardiac computed tomography (CT) is a painless test that uses an x-ray machine to take clear, detailed pictures of your heart. For further information please visit [email or web address] Please follow instruction sheet as given.  Atrial Fibrillation Ablation  Your physician has recommended that you have an ablation. Catheter ablation is a medical procedure used to treat some cardiac arrhythmias (irregular heartbeats). During catheter ablation, a long, thin, flexible tube is put into a blood vessel in your groin (upper thigh), or neck. This tube is called an ablation catheter. It is then guided to your heart through the blood vessel. Radio frequency waves destroy small areas of heart tissue where abnormal heartbeats may cause an arrhythmia to start. Please see the instruction sheet given to you today.  You are scheduled for Atrial Fibrillation Ablation on Friday, March 21 with Dr. Deirdre Furbish.Please arrive at the Main Entrance A at [facility]: [address] [place], [place] [address]   Follow-Up: At [facility], you and your health  needs are our priority.  As part of our continuing mission to provide you with exceptional heart care, we have created designated Provider Care Teams.  These Care Teams include your primary Cardiologist (physician) and Advanced Practice Providers (APPs -  Physician Assistants and Nurse Practitioners) who all work together to provide you with the care you need, when you need it.  We recommend signing up for the patient portal called MyChart.  Sign up information is provided on this After Visit Summary.  MyChart is used to connect with patients for Virtual Visits (Telemedicine).  Patients are able to view lab/test results, encounter notes, upcoming appointments, etc.  Non-urgent messages can be sent to your provider as well.   To learn more about what you can do with MyChart, go to [email or web address].    Your next appointment:   We will schedule follow up after your ablation   Provider:   Eulas Furbish, MD

## 2023-06-15 ENCOUNTER — Other Ambulatory Visit (HOSPITAL_COMMUNITY): Payer: Self-pay

## 2023-06-16 ENCOUNTER — Encounter: Payer: Self-pay | Admitting: Cardiovascular Disease

## 2023-06-16 ENCOUNTER — Ambulatory Visit: Payer: PPO | Attending: Cardiovascular Disease | Admitting: Cardiovascular Disease

## 2023-06-16 VITALS — BP 124/82 | HR 63 | Ht 74.0 in | Wt 202.2 lb

## 2023-06-16 DIAGNOSIS — E7800 Pure hypercholesterolemia, unspecified: Secondary | ICD-10-CM | POA: Diagnosis not present

## 2023-06-16 DIAGNOSIS — D6869 Other thrombophilia: Secondary | ICD-10-CM | POA: Diagnosis not present

## 2023-06-16 DIAGNOSIS — I2510 Atherosclerotic heart disease of native coronary artery without angina pectoris: Secondary | ICD-10-CM

## 2023-06-16 DIAGNOSIS — I480 Paroxysmal atrial fibrillation: Secondary | ICD-10-CM

## 2023-06-16 DIAGNOSIS — G4733 Obstructive sleep apnea (adult) (pediatric): Secondary | ICD-10-CM

## 2023-06-16 DIAGNOSIS — M26609 Unspecified temporomandibular joint disorder, unspecified side: Secondary | ICD-10-CM

## 2023-06-16 NOTE — Patient Instructions (Signed)
Medication Instructions:  No changes *If you need a refill on your cardiac medications before your next appointment, please call your pharmacy*   Follow-Up: At [facility], you and your health needs are our priority.  As part of our continuing mission to provide you with exceptional heart care, we have created designated Provider Care Teams.  These Care Teams include your primary Cardiologist (physician) and Advanced Practice Providers (APPs -  Physician Assistants and Nurse Practitioners) who all work together to provide you with the care you need, when you need it.  We recommend signing up for the patient portal called "MyChart".  Sign up information is provided on this After Visit Summary.  MyChart is used to connect with patients for Virtual Visits (Telemedicine).  Patients are able to view lab/test results, encounter notes, upcoming appointments, etc.  Non-urgent messages can be sent to your provider as well.   To learn more about what you can do with MyChart, go to [email or web address].    Your next appointment:    9 mos= October   Provider:   Thurmon Fair, MD

## 2023-06-16 NOTE — Progress Notes (Unsigned)
Cardiology Office Note:    Date:  06/18/2023   ID:  [name], DOB December 06, 1955, MRN [id number]  PCP:  Emilio Aspen, MD   [facility] Providers Cardiologist:  Thurmon Fair, MD Electrophysiologist:  Maurice Small, MD     Referring MD: Emilio Aspen, *   Chief Complaint  Patient presents with   Coronary Artery Disease   Atrial Fibrillation  .   History of Present Illness:    Andrew [name] is a 68 y.o. male with a hx of CAD, paroxysmal atrial fibrillation, dyslipidemia.  His symptoms have been atypical, but concerning for angina, maybe precipitated by episodes of rapid arrhythmia.  Workup started with a coronary calcium score which was severely elevated at 1920 (95th percentile) and also showed borderline dilation of the ascending aorta 38 mm.  This was followed up with a cardiac PET scan which showed ischemia in the LAD artery distribution and normal LVEF 55%.  Echo confirmed normal LVEF and mildly dilated ascending aorta with mild aortic insufficiency.  He underwent cardiac catheterization 03/10/2023 and was found to have an 80% stenosis of the proximal LAD artery treated with a drug-eluting stent (Synergy XD 2.50 x 20), with moderate 30-40% stenoses scattered throughout the other coronary territories.  He is currently taking clopidogrel and Eliquis, no longer on aspirin.  He has done well since the catheterization, although it is hard to say whether has been any improvement in symptoms.  Also found to have extremely frequent episodes of paroxysmal atrial fibrillation (overall burden 9%,, rate controlled at night, with RVR during activity; PACs 15%, also episodes of ectopic atrial tachycardia with RVR) in a pattern consistent with pulmonary vein tachycardia.  He is scheduled for a cardiac CT on February 26 in order to plan scheduled ablation with Dr. Nelly Laurence on March 21.  He has a history of sleep apnea treated with CPAP, followed by Dr. Annalee Genta in the ENT  clinic.  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  He has a remote history of hiatal hernia repair.  Has been taking lipid-lowering drugs for about 30 years now.  After adding ezetimibe his LDL cholesterol on 06/12/2023 was 67 with acceptable HDL of 43, normal triglycerides.  He does not have diabetes mellitus.  He does not smoke.  Past Medical History:  Diagnosis Date   Cancer [facility])    skin cancer on nose 2017   Difficult intubation    Needs to use pediatric intubation due to TMJ surgery   GERD (gastroesophageal reflux disease)    History of hiatal hernia    Nocturnal leg cramps 04/04/2017   Peripheral neuropathy 12/05/2016   Sleep apnea     Past Surgical History:  Procedure Laterality Date   COLONOSCOPY WITH PROPOFOL N/A 05/16/2016   Procedure: COLONOSCOPY WITH PROPOFOL;  Surgeon: Charolett Bumpers, MD;  Location: [facility] ENDOSCOPY;  Service: Endoscopy;  Laterality: N/A;   CORONARY LITHOTRIPSY N/A 03/10/2023   Procedure: CORONARY LITHOTRIPSY;  Surgeon: Kathleene Hazel, MD;  Location: [facility] INVASIVE CV LAB;  Service: Cardiovascular;  Laterality: N/A;   CORONARY STENT INTERVENTION N/A 03/10/2023   Procedure: CORONARY STENT INTERVENTION;  Surgeon: Kathleene Hazel, MD;  Location: [facility] INVASIVE CV LAB;  Service: Cardiovascular;  Laterality: N/A;   HIATAL HERNIA REPAIR     14 years ago   LEFT HEART CATH AND CORONARY ANGIOGRAPHY N/A 03/10/2023   Procedure: LEFT HEART CATH AND  CORONARY ANGIOGRAPHY;  Surgeon: Kathleene Hazel, MD;  Location: [facility] INVASIVE CV LAB;  Service: Cardiovascular;  Laterality: N/A;   LUMBAR DISC SURGERY  2014   L4   total TMJ replacement Bilateral    15-18 years ago    Current Medications: Current Meds  Medication Sig   amoxicillin (AMOXIL) 500 MG tablet Take 2,000 mg by mouth See admin instructions. Take 1 hour prior to dental procedures   apixaban (ELIQUIS)  5 MG TABS tablet Take 1 tablet (5 mg total) by mouth 2 (two) times daily.   ascorbic acid (VITAMIN C) 1000 MG tablet Take 1,000 mg by mouth 2 (two) times daily.   atorvastatin (LIPITOR) 40 MG tablet Take 40 mg by mouth daily.   cetirizine (ZYRTEC) 10 MG tablet Take 10 mg by mouth daily as needed for allergies.   Cholecalciferol (VITAMIN D3) 125 MCG (5000 UT) CAPS Take 5,000 Units by mouth daily.   clopidogrel (PLAVIX) 75 MG tablet Take 1 tablet (75 mg total) by mouth daily.   Coenzyme Q10 (COQ10) 100 MG CAPS Take 100 mg by mouth in the morning and at bedtime.   Cyanocobalamin (B-12) 2500 MCG TABS Take 2,500 mcg by mouth daily.   doxycycline (ADOXA) 100 MG tablet Take 100 mg by mouth daily as needed (Rosacea).   ezetimibe (ZETIA) 10 MG tablet Take 1 tablet (10 mg total) by mouth daily.   fluticasone (FLONASE) 50 MCG/ACT nasal spray Place 2 sprays into both nostrils daily as needed for allergies or rhinitis.   loratadine-pseudoephedrine (CLARITIN-D 12 HOUR) 5-120 MG tablet Take 1 tablet by mouth daily as needed for allergies.   melatonin 5 MG TABS Take 10 mg by mouth at bedtime.   metoprolol succinate (TOPROL XL) 25 MG 24 hr tablet Take 1 tablet (25 mg total) by mouth daily.   Multiple Vitamins-Minerals (MULTIVITAMIN WITH MINERALS) tablet Take 1 tablet by mouth daily. Centrum for Men 50+   Multiple Vitamins-Minerals (PRESERVISION AREDS 2+MULTI VIT PO) Take 1 tablet by mouth in the morning and at bedtime.   nitroGLYCERIN (NITROSTAT) 0.4 MG SL tablet Place 1 tablet (0.4 mg total) under the tongue every 5 (five) minutes as needed for chest pain.   Omega-3 Fatty Acids (FISH OIL PO) Take 1,200 mg by mouth 2 (two) times daily.   pantoprazole (PROTONIX) 40 MG tablet Take 1 tablet (40 mg total) by mouth every Monday, Wednesday, and Friday.   traZODone (DESYREL) 50 MG tablet Take 50-100 mg by mouth at bedtime as needed for sleep.   tretinoin (RETIN-A) 0.1 % cream Apply 1 Application topically daily as  needed (rosacea).     Allergies:   Other and Erythromycin   Social History   Socioeconomic History   Marital status: Single    Spouse name: Not on file   Number of children: 0   Years of education: 2 years college   Highest education level: Not on file  Occupational History   Occupation: Surveyor, minerals  Tobacco Use   Smoking status: Never   Smokeless tobacco: Never  Vaping Use   Vaping status: Never Used  Substance and Sexual Activity   Alcohol use: Yes    Comment: 2 beers or mixed drinks a week   Drug use: No   Sexual activity: Not on file  Other Topics Concern   Not on file  Social History Narrative   Lives at home with his girlfriend.   Right-handed.   3 cups caffeine daily.   Social Drivers of Health  Financial Resource Strain: Not on file  Food Insecurity: Not on file  Transportation Needs: Not on file  Physical Activity: Not on file  Stress: Not on file  Social Connections: Not on file     Family History: The patient's family history includes cerebral aneurysm in his mother, who died in her 14s.  Multiple family members had heart problems, but not at young ages.  ROS:   Please see the history of present illness.     All other systems reviewed and are negative.  EKGs/Labs/Other Studies Reviewed:    The following studies were reviewed today: Cardiac catheterization 03/10/2023       Prox RCA lesion is 30% stenosed.   Mid RCA lesion is 30% stenosed.   Dist RCA lesion is 30% stenosed.   Ost Cx to Prox Cx lesion is 40% stenosed.   Mid Cx to Dist Cx lesion is 40% stenosed.   Dist LAD lesion is 20% stenosed.   Prox LAD to Mid LAD lesion is 80% stenosed.   A drug-eluting stent was successfully placed using a SYNERGY XD 2.50X20.   Post intervention, there is a 0% residual stenosis.   Severe, heavily calcified mid LAD stenosis Successful PTCA/Intravascular Lithotripsy/DES x 1 mid LAD Mild non-obstructive disease in the large, dominant RCA and in the  Circumflex   Recommendations: Will plan same day post PCI discharge. Will use DAPT with ASA/Plavix for one month then stop ASA. Resume Eliquis tomorrow. Anticipate 12 months of Plavix therapy.     Recent Labs: 02/09/2023: BUN 25; Creatinine, Ser 1.21; Hemoglobin 15.5; Platelets 170; Potassium 4.0; Sodium 140  Recent Lipid Panel    Component Value Date/Time   CHOL 129 06/12/2023 0855   TRIG 102 06/12/2023 0855   HDL 43 06/12/2023 0855   CHOLHDL 3.0 06/12/2023 0855   LDLCALC 67 06/12/2023 0855   12/20/2022 cholesterol 141, HDL 37, LDL 91, triglycerides 69 TSH 2.010   Risk Assessment/Calculations:    CHA2DS2-VASc Score = 2   This indicates a 2.2% annual risk of stroke. The patient's score is based upon: CHF History: 0 HTN History: 0 Diabetes History: 0 Stroke History: 0 Vascular Disease History: 1 Age Score: 1 Gender Score: 0           Physical Exam:    VS:  BP 124/82 (BP Location: Left Arm, Patient Position: Sitting, Cuff Size: Normal)   Pulse 63   Ht 6\' 2"  (1.88 m)   Wt 202 lb 3.2 oz (91.7 kg)   SpO2 96%   BMI 25.96 kg/m     Wt Readings from Last 3 Encounters:  06/16/23 202 lb 3.2 oz (91.7 kg)  06/12/23 201 lb (91.2 kg)  03/17/23 196 lb (88.9 kg)     GEN: Appears fit and younger than stated age, well nourished, well developed in no acute distress HEENT: Normal NECK: No JVD; No carotid bruits LYMPHATICS: No lymphadenopathy CARDIAC: RRR, no murmurs, rubs, gallops RESPIRATORY:  Clear to auscultation without rales, wheezing or rhonchi  ABDOMEN: Soft, non-tender, non-distended MUSCULOSKELETAL:  No edema; No deformity  SKIN: Warm and dry NEUROLOGIC:  Alert and oriented x 3 PSYCHIATRIC:  Normal affect   ASSESSMENT:    1. Paroxysmal atrial fibrillation (HCC)   2. Acquired thrombophilia (HCC)   3. Coronary artery disease involving native coronary artery of native heart without angina pectoris   4. Hypercholesterolemia   5. OSA on CPAP   6.  Temporomandibular joint syndrome     PLAN:    In order  of problems listed above:  Paroxysmal atrial fibrillation: On Eliquis and metoprolol.  Plan for CT a next month and A-fib ablation in March with Dr. Nelly Laurence. Anticoagulation: Tolerating combination Eliquis and clopidogrel without any bleeding complications. CAD: Hard to say whether there is been any change in his symptoms following placement of the LAD artery stent.  Plan to remain on clopidogrel for at least 6 months (until April 2025), preferably 12 months (until October 2025), but since the stent was not placed in the setting of acute coronary syndrome I think it is perfectly fine to temporarily discontinue the clopidogrel if Dr. Nelly Laurence thinks it would make his ablation safer. HLP: All lipid parameters at target, LDL is less than 67.  Continue combination ezetimibe and atorvastatin. OSA: Has been using CPAP and was followed by Dr. Annalee Genta at least until 2021.  Have reviewed this from relationship between untreated OSA and A-fib. History of TMJ replacement surgery: Keep this in mind when considering procedures that require general anesthesia and intubation.           Medication Adjustments/Labs and Tests Ordered: Current medicines are reviewed at length with the patient today.  Concerns regarding medicines are outlined above.  No orders of the defined types were placed in this encounter.  No orders of the defined types were placed in this encounter.   Patient Instructions  Medication Instructions:  No changes *If you need a refill on your cardiac medications before your next appointment, please call your pharmacy*   Follow-Up: At [facility], you and your health needs are our priority.  As part of our continuing mission to provide you with exceptional heart care, we have created designated Provider Care Teams.  These Care Teams include your primary Cardiologist (physician) and Advanced Practice Providers (APPs -   Physician Assistants and Nurse Practitioners) who all work together to provide you with the care you need, when you need it.  We recommend signing up for the patient portal called "MyChart".  Sign up information is provided on this After Visit Summary.  MyChart is used to connect with patients for Virtual Visits (Telemedicine).  Patients are able to view lab/test results, encounter notes, upcoming appointments, etc.  Non-urgent messages can be sent to your provider as well.   To learn more about what you can do with MyChart, go to [email or web address].    Your next appointment:    9 mos= October   Provider:   Thurmon Fair, MD             Signed, Thurmon Fair, MD  06/18/2023 9:45 PM    [facility]

## 2023-06-18 DIAGNOSIS — M26609 Unspecified temporomandibular joint disorder, unspecified side: Secondary | ICD-10-CM | POA: Insufficient documentation

## 2023-06-18 DIAGNOSIS — G4733 Obstructive sleep apnea (adult) (pediatric): Secondary | ICD-10-CM | POA: Insufficient documentation

## 2023-06-18 DIAGNOSIS — E7800 Pure hypercholesterolemia, unspecified: Secondary | ICD-10-CM | POA: Insufficient documentation

## 2023-07-05 ENCOUNTER — Telehealth: Payer: Self-pay | Admitting: Cardiovascular Disease

## 2023-07-05 DIAGNOSIS — G4733 Obstructive sleep apnea (adult) (pediatric): Secondary | ICD-10-CM

## 2023-07-05 NOTE — Telephone Encounter (Signed)
 Yes, please. I thought we had already done it

## 2023-07-05 NOTE — Telephone Encounter (Signed)
 New Message:     Patient was calling to see if Dr C had sent a referral to Dr Micael Adas for his Sleep Clinic visit.

## 2023-07-21 NOTE — Telephone Encounter (Signed)
Corrected referral entered and Leah, sleep scheduler, made aware.

## 2023-07-25 ENCOUNTER — Telehealth (HOSPITAL_COMMUNITY): Payer: Self-pay

## 2023-07-25 NOTE — Telephone Encounter (Signed)
 Attempted to reach patient to discuss upcoming procedure, no answer. Unable to leave a VM- due to mailbox full.

## 2023-07-26 ENCOUNTER — Ambulatory Visit (HOSPITAL_COMMUNITY)
Admission: RE | Admit: 2023-07-26 | Discharge: 2023-07-26 | Disposition: A | Discharge status: Home / Self Care | Payer: PPO | Source: Ambulatory Visit | Attending: Cardiovascular Disease | Admitting: Cardiovascular Disease

## 2023-07-26 DIAGNOSIS — I480 Paroxysmal atrial fibrillation: Secondary | ICD-10-CM | POA: Diagnosis not present

## 2023-07-26 MED ORDER — IOHEXOL 350 MG/ML SOLN
95.0000 mL | Freq: Once | INTRAVENOUS | Status: AC | PRN
  Administered 2023-07-26: 95 mL via INTRAVENOUS

## 2023-07-26 NOTE — Telephone Encounter (Signed)
 Patient has been scheduled for a consult with Dr. Mayford Knife on 08/04/23.

## 2023-07-27 ENCOUNTER — Encounter: Payer: Self-pay | Admitting: Cardiovascular Disease

## 2023-07-27 NOTE — Telephone Encounter (Signed)
 Attempted to reach patient to discuss upcoming procedure, no answer. Left VM for patient to return call.

## 2023-07-27 NOTE — Telephone Encounter (Signed)
 Spoke with patient to complete one month pre-procedure call.     New medical conditions?  No Recent hospitalizations or surgeries? No Started any new medications? No Patient made aware to contact office to inform of any new medications started. Any changes in activities of daily living? No  Pre-procedure testing scheduled: CT completed on 07/26/23 and lab work ordered.   Confirmed patient is taking Eliquis and will continue taking medication before procedure or it may need to be rescheduled.  Confirmed patient is scheduled for Atrial Fibrillation Ablation on Friday, March 21 with Dr. York Pellant. Instructed patient to arrive at the Main Entrance A at [facility]: [address] [place], [place] [address] and check in at Admitting at 5:30 AM  Advised of plan to go home the same day and will only stay overnight if medically necessary. You MUST have a responsible adult to drive you home and MUST be with you the first 24 hours after you arrive home or your procedure could be cancelled.  Patient verbalized understanding to information provided and is agreeable to proceed with procedure.

## 2023-08-04 ENCOUNTER — Ambulatory Visit: Payer: PPO | Attending: Cardiology | Admitting: Cardiology

## 2023-08-04 ENCOUNTER — Encounter: Payer: Self-pay | Admitting: Cardiology

## 2023-08-04 VITALS — BP 126/74 | HR 54 | Ht 74.0 in | Wt 209.4 lb

## 2023-08-04 DIAGNOSIS — G4733 Obstructive sleep apnea (adult) (pediatric): Secondary | ICD-10-CM | POA: Diagnosis not present

## 2023-08-04 NOTE — Progress Notes (Signed)
 Sleep Medicine CONSULT Note    Date:  08/04/2023   ID:  Andrew [name], DOB Apr 05, 1956, MRN [id number]  PCP:  Emilio Aspen, MD  Cardiologist: Thurmon Fair, MD   Chief Complaint  Patient presents with   New Patient (Initial Visit)    OSA    History of Present Illness:  Andrew [name] is a 68 y.o. male who is being seen today for the evaluation of obstructive sleep apnea at the request of Thurmon Fair, MD.  This is a 68 year old male with a history of GERD with hiatal hernia, CAD, PAF and obstructive sleep apnea intolerant to CPAP.  He had been following with Dr. Pete Glatter for his OSA but since then he retired and has not seen anyone since then for his CPAP.  He does not have a DME currently.  He had been using [employer] but stopped using them 3 years ago.   I do not have a copy of his last sleep study that he had done at least 10 years ago but apparently has moderate to severe obstructive sleep apnea .    He has not had any new supplies in 3 years and his mask no longer fits and has really been struggling with his device due to lack of supplies.  He has been feeling tired during the day but does not know if he is snoring.   Past [name] History:  Diagnosis Date   Cancer [facility])    skin cancer on nose 2017   Difficult intubation    Needs to use pediatric intubation due to TMJ surgery   GERD (gastroesophageal reflux disease)    History of hiatal hernia    Nocturnal leg cramps 04/04/2017   Peripheral neuropathy 12/05/2016   Sleep apnea     Past Surgical History:  Procedure Laterality Date   COLONOSCOPY WITH PROPOFOL N/A 05/16/2016   Procedure: COLONOSCOPY WITH PROPOFOL;  Surgeon: Charolett Bumpers, MD;  Location: [facility] ENDOSCOPY;  Service: Endoscopy;  Laterality: N/A;   CORONARY LITHOTRIPSY N/A 03/10/2023   Procedure: CORONARY LITHOTRIPSY;  Surgeon: Kathleene Hazel, MD;  Location: [facility] INVASIVE CV LAB;  Service: Cardiovascular;  Laterality: N/A;   CORONARY STENT  INTERVENTION N/A 03/10/2023   Procedure: CORONARY STENT INTERVENTION;  Surgeon: Kathleene Hazel, MD;  Location: [facility] INVASIVE CV LAB;  Service: Cardiovascular;  Laterality: N/A;   HIATAL HERNIA REPAIR     14 years ago   LEFT HEART CATH AND CORONARY ANGIOGRAPHY N/A 03/10/2023   Procedure: LEFT HEART CATH AND CORONARY ANGIOGRAPHY;  Surgeon: Kathleene Hazel, MD;  Location: [facility] INVASIVE CV LAB;  Service: Cardiovascular;  Laterality: N/A;   LUMBAR DISC SURGERY  2014   L4   total TMJ replacement Bilateral    15-18 years ago    Current Medications: Current Meds  Medication Sig   amoxicillin (AMOXIL) 500 MG tablet Take 2,000 mg by mouth See admin instructions. Take 1 hour prior to dental procedures   apixaban (ELIQUIS) 5 MG TABS tablet Take 1 tablet (5 mg total) by mouth 2 (two) times daily.   ascorbic acid (VITAMIN C) 1000 MG tablet Take 1,000 mg by mouth 2 (two) times daily.   atorvastatin (LIPITOR) 40 MG tablet Take 40 mg by mouth daily.   cetirizine (ZYRTEC) 10 MG tablet Take 10 mg by mouth daily as needed for allergies.   Cholecalciferol (VITAMIN D3) 125 MCG (5000 UT) CAPS Take 5,000 Units by mouth daily.   clopidogrel (PLAVIX) 75 MG tablet  Take 1 tablet (75 mg total) by mouth daily.   Coenzyme Q10 (COQ10) 100 MG CAPS Take 100 mg by mouth in the morning and at bedtime.   Cyanocobalamin (B-12) 2500 MCG TABS Take 2,500 mcg by mouth daily.   doxycycline (ADOXA) 100 MG tablet Take 100 mg by mouth daily as needed (Rosacea).   ezetimibe (ZETIA) 10 MG tablet Take 1 tablet (10 mg total) by mouth daily.   fluticasone (FLONASE) 50 MCG/ACT nasal spray Place 2 sprays into both nostrils daily as needed for allergies or rhinitis.   loratadine-pseudoephedrine (CLARITIN-D 12 HOUR) 5-120 MG tablet Take 1 tablet by mouth daily as needed for allergies.   melatonin 5 MG TABS Take 10 mg by mouth at bedtime.   metoprolol succinate (TOPROL XL) 25 MG 24 hr tablet Take 1 tablet (25 mg total) by mouth  daily.   Multiple Vitamins-Minerals (MULTIVITAMIN WITH MINERALS) tablet Take 1 tablet by mouth daily. Centrum for Men 50+   Multiple Vitamins-Minerals (PRESERVISION AREDS 2+MULTI VIT PO) Take 1 tablet by mouth in the morning and at bedtime.   nitroGLYCERIN (NITROSTAT) 0.4 MG SL tablet Place 1 tablet (0.4 mg total) under the tongue every 5 (five) minutes as needed for chest pain.   Omega-3 Fatty Acids (FISH OIL PO) Take 1,200 mg by mouth 2 (two) times daily.   pantoprazole (PROTONIX) 40 MG tablet Take 1 tablet (40 mg total) by mouth every Monday, Wednesday, and Friday.   traZODone (DESYREL) 50 MG tablet Take 50-100 mg by mouth at bedtime as needed for sleep.   tretinoin (RETIN-A) 0.1 % cream Apply 1 Application topically daily as needed (rosacea).    Allergies:   Other and Erythromycin   Social History   Socioeconomic History   Marital status: Single    Spouse name: Not on file   Number of children: 0   Years of education: 2 years college   Highest education level: Not on file  Occupational History   Occupation: Surveyor, minerals  Tobacco Use   Smoking status: Never   Smokeless tobacco: Never  Vaping Use   Vaping status: Never Used  Substance and Sexual Activity   Alcohol use: Yes    Comment: 2 beers or mixed drinks a week   Drug use: No   Sexual activity: Not on file  Other Topics Concern   Not on file  Social History Narrative   Lives at home with his girlfriend.   Right-handed.   3 cups caffeine daily.   Social Drivers of Corporate investment banker Strain: Not on file  Food Insecurity: Not on file  Transportation Needs: Not on file  Physical Activity: Not on file  Stress: Not on file  Social Connections: Not on file     Family History:  The patient's family history includes Anuerysm in his mother.   ROS:   Please see the history of present illness.    ROS All other systems reviewed and are negative.      No data to display             PHYSICAL EXAM:   VS:   BP 126/74   Pulse (!) 54   Ht 6\' 2"  (1.88 m)   Wt 209 lb 6.4 oz (95 kg)   SpO2 95%   BMI 26.89 kg/m    GEN: Well nourished, well developed, in no acute distress  HEENT: normal  Neck: no JVD, carotid bruits, or masses Cardiac: irregular; no murmurs, rubs, or gallops,no edema.  Intact  distal pulses bilaterally.  Respiratory:  clear to auscultation bilaterally, normal work of breathing GI: soft, nontender, nondistended, + BS MS: no deformity or atrophy  Skin: warm and dry, no rash Neuro:  Alert and Oriented x 3, Strength and sensation are intact Psych: euthymic mood, full affect  Wt Readings from Last 3 Encounters:  08/04/23 209 lb 6.4 oz (95 kg)  06/16/23 202 lb 3.2 oz (91.7 kg)  06/12/23 201 lb (91.2 kg)      Studies/Labs Reviewed:   none  Recent Labs: 02/09/2023: BUN 25; Creatinine, Ser 1.21; Hemoglobin 15.5; Platelets 170; Potassium 4.0; Sodium 140    CHA2DS2-VASc Score = 2   This indicates a 2.2% annual risk of stroke. The patient's score is based upon: CHF History: 0 HTN History: 0 Diabetes History: 0 Stroke History: 0 Vascular Disease History: 1 Age Score: 1 Gender Score: 0   Additional studies/ records that were reviewed today include:  Notes from 2021 Dr. Annalee Genta note    ASSESSMENT:    1. OSA on CPAP      PLAN:  In order of problems listed above:  OSA  -he has a hx of OSA in the past and is on CPAP but has not had an DME in over 3 years and has not gotten any supplies recently -he thinks his device is 40-87 years old -I am going to get a split night sleep study and then get him a new CPAP device   Time Spent: 20 minutes total time of encounter, including 15 minutes spent in face-to-face patient care on the date of this encounter. This time includes coordination of care and counseling regarding above mentioned problem list. Remainder of non-face-to-face time involved reviewing chart documents/testing relevant to the patient encounter and  documentation in the [name] record. I have independently reviewed documentation from referring provider  Medication Adjustments/Labs and Tests Ordered: Current medicines are reviewed at length with the patient today.  Concerns regarding medicines are outlined above.  Medication changes, Labs and Tests ordered today are listed in the Patient Instructions below.  There are no Patient Instructions on file for this visit.   Signed, Armanda Magic, MD  08/04/2023 9:57 AM    [name] [address] [place], [place], [place]  [address] Phone: [phone]; Fax: [phone]

## 2023-08-04 NOTE — Patient Instructions (Signed)
 Testing/Procedures: Your physician has recommended that you have a sleep study. This test records several body functions during sleep, including: brain activity, eye movement, oxygen and carbon dioxide blood levels, heart rate and rhythm, breathing rate and rhythm, the flow of air through your mouth and nose, snoring, body muscle movements, and chest and belly movement.   Follow-Up: At [facility], you and your health needs are our priority.  As part of our continuing mission to provide you with exceptional heart care, we have created designated Provider Care Teams.  These Care Teams include your primary Cardiologist (physician) and Advanced Practice Providers (APPs -  Physician Assistants and Nurse Practitioners) who all work together to provide you with the care you need, when you need it.  We recommend signing up for the patient portal called "MyChart".  Sign up information is provided on this After Visit Summary.  MyChart is used to connect with patients for Virtual Visits (Telemedicine).  Patients are able to view lab/test results, encounter notes, upcoming appointments, etc.  Non-urgent messages can be sent to your provider as well.   To learn more about what you can do with MyChart, go to [email or web address].    Your next appointment:   To be seen after sleep study  Provider:   Mayford Knife  Other Instructions   1st Floor: - Lobby - Registration  - Pharmacy  - Lab - Cafe  2nd Floor: - PV Lab - Diagnostic Testing (echo, CT, nuclear med)  3rd Floor: - Vacant  4th Floor: - TCTS (cardiothoracic surgery) - AFib Clinic - Structural Heart Clinic - Vascular Surgery  - Vascular Ultrasound  5th Floor: - HeartCare Cardiology (general and EP) - Clinical Pharmacy for coumadin, hypertension, lipid, weight-loss medications, and med management appointments    Valet parking services will be available as well.

## 2023-08-04 NOTE — Addendum Note (Signed)
 Addended by: Erick Alley on: 08/04/2023 10:12 AM   Modules accepted: Orders

## 2023-08-09 ENCOUNTER — Telehealth: Payer: Self-pay | Admitting: Cardiology

## 2023-08-09 NOTE — Telephone Encounter (Signed)
 Pt stated at his last office visit he was promised he'd get a prescription for a new mask, tubing and head gear before leaving out of the office. Pt stated it's days later and he still hasn't received it. His supplies are years old so it's urgent to get the new items he's in need of as promised. Pt is requesting a callback to discuss further. Please advise

## 2023-08-10 ENCOUNTER — Encounter: Payer: Self-pay | Admitting: Cardiology

## 2023-08-10 DIAGNOSIS — G4733 Obstructive sleep apnea (adult) (pediatric): Secondary | ICD-10-CM

## 2023-08-10 DIAGNOSIS — I2510 Atherosclerotic heart disease of native coronary artery without angina pectoris: Secondary | ICD-10-CM

## 2023-08-11 DIAGNOSIS — I480 Paroxysmal atrial fibrillation: Secondary | ICD-10-CM | POA: Diagnosis not present

## 2023-08-11 NOTE — Telephone Encounter (Signed)
 I had no idea about this but I have printed the Rx and placed it at the front desk for patient pick up.

## 2023-08-11 NOTE — Telephone Encounter (Signed)
 I had no idea about this but I have printed the Rx and placed it at the front desk for patient pick up.  Patient does not have a dme he gets his supplies off line.

## 2023-08-12 LAB — BASIC METABOLIC PANEL
BUN/Creatinine Ratio: 14 (ref 10–24)
BUN: 18 mg/dL (ref 8–27)
CO2: 24 mmol/L (ref 20–29)
Calcium: 9.2 mg/dL (ref 8.6–10.2)
Chloride: 100 mmol/L (ref 96–106)
Creatinine, Ser: 1.26 mg/dL (ref 0.76–1.27)
Glucose: 123 mg/dL — ABNORMAL HIGH (ref 70–99)
Potassium: 4.7 mmol/L (ref 3.5–5.2)
Sodium: 139 mmol/L (ref 134–144)
eGFR: 62 mL/min/{1.73_m2} (ref >59)

## 2023-08-12 LAB — CBC
Hematocrit: 44.4 % (ref 37.5–51.0)
Hemoglobin: 15.3 g/dL (ref 13.0–17.7)
MCH: 30.1 pg (ref 26.6–33.0)
MCHC: 34.5 g/dL (ref 31.5–35.7)
MCV: 87 fL (ref 79–97)
Platelets: 174 10*3/uL (ref 150–450)
RBC: 5.08 x10E6/uL (ref 4.14–5.80)
RDW: 13.3 % (ref 11.6–15.4)
WBC: 6.1 10*3/uL (ref 3.4–10.8)

## 2023-08-14 ENCOUNTER — Encounter: Payer: Self-pay | Admitting: Cardiovascular Disease

## 2023-08-14 ENCOUNTER — Telehealth (HOSPITAL_COMMUNITY): Payer: Self-pay

## 2023-08-14 NOTE — Telephone Encounter (Signed)
 Call placed to patient to discuss upcoming procedure.   CT: completed.  Labs: completed.   Any recent signs of acute illness or been started on antibiotics? No Any new medications started? No Any medications to hold? No Any missed doses of blood thinner? No Advised patient to continue taking ANTICOAGULANT: Eliquis (Apixaban) without missing any doses.  Medication instructions:  On the morning of your procedure DO NOT take any medication., including Eliquis or the procedure may be rescheduled. Nothing to eat or drink after midnight prior to your procedure.  Confirmed patient is scheduled for Atrial Fibrillation Ablation on Friday, March 21 with Dr. York Pellant. Instructed patient to arrive at the Main Entrance A at [facility]: [address] [place], [place] [address] and check in at Admitting at 5:30 AM  Advised of plan to go home the same day and will only stay overnight if medically necessary. You MUST have a responsible adult to drive you home and MUST be with you the first 24 hours after you arrive home or your procedure could be cancelled.  Patient verbalized understanding to all instructions provided and agreed to proceed with procedure.

## 2023-08-14 NOTE — Telephone Encounter (Signed)
 Spoke with patient, confirmed he was able to get prescription for his CPAP supplies. No further needs at this time

## 2023-08-17 ENCOUNTER — Telehealth: Payer: Self-pay | Admitting: *Deleted

## 2023-08-17 DIAGNOSIS — I2510 Atherosclerotic heart disease of native coronary artery without angina pectoris: Secondary | ICD-10-CM

## 2023-08-17 DIAGNOSIS — I25118 Atherosclerotic heart disease of native coronary artery with other forms of angina pectoris: Secondary | ICD-10-CM

## 2023-08-17 DIAGNOSIS — G4733 Obstructive sleep apnea (adult) (pediatric): Secondary | ICD-10-CM

## 2023-08-17 NOTE — Telephone Encounter (Signed)
-----   Message from Nurse Vena Austria sent at 08/04/2023 10:03 AM EST ----- Regarding: Cpap Hello, Dr. Mayford Knife has asked for this pt to be sent to [facility]. He also needs a new mask for his cpap machine Thank you

## 2023-08-17 NOTE — Pre-Procedure Instructions (Signed)
 Instructed patient on the following items: Arrival time 0515 Nothing to eat or drink after midnight No meds AM of procedure Responsible person to drive you home and stay with you for 24 hrs  Have you missed any doses of anti-coagulant- Eliquis should be taken twice a day,  if you have missed any doses please let us know.  Don't take dose morning of procedure.

## 2023-08-17 NOTE — Telephone Encounter (Signed)
Order placed to [facility] via community message 

## 2023-08-18 ENCOUNTER — Ambulatory Visit (HOSPITAL_COMMUNITY): Payer: Self-pay | Admitting: Anesthesiology

## 2023-08-18 ENCOUNTER — Encounter (HOSPITAL_COMMUNITY): Payer: Self-pay | Admitting: Cardiovascular Disease

## 2023-08-18 ENCOUNTER — Ambulatory Visit (HOSPITAL_BASED_OUTPATIENT_CLINIC_OR_DEPARTMENT_OTHER): Payer: Self-pay | Admitting: Anesthesiology

## 2023-08-18 ENCOUNTER — Other Ambulatory Visit: Payer: Self-pay

## 2023-08-18 ENCOUNTER — Ambulatory Visit (HOSPITAL_COMMUNITY)
Admission: RE | Admit: 2023-08-18 | Discharge: 2023-08-18 | Disposition: A | Discharge status: Home / Self Care | Payer: PPO | Attending: Cardiovascular Disease | Admitting: Cardiovascular Disease

## 2023-08-18 ENCOUNTER — Encounter (HOSPITAL_COMMUNITY)
Admission: RE | Disposition: A | Discharge status: Home / Self Care | Payer: Self-pay | Source: Home / Self Care | Attending: Cardiovascular Disease

## 2023-08-18 DIAGNOSIS — G4733 Obstructive sleep apnea (adult) (pediatric): Secondary | ICD-10-CM | POA: Diagnosis not present

## 2023-08-18 DIAGNOSIS — I480 Paroxysmal atrial fibrillation: Secondary | ICD-10-CM | POA: Diagnosis not present

## 2023-08-18 DIAGNOSIS — I4891 Unspecified atrial fibrillation: Secondary | ICD-10-CM

## 2023-08-18 DIAGNOSIS — G4730 Sleep apnea, unspecified: Secondary | ICD-10-CM | POA: Insufficient documentation

## 2023-08-18 DIAGNOSIS — E7800 Pure hypercholesterolemia, unspecified: Secondary | ICD-10-CM

## 2023-08-18 DIAGNOSIS — I2510 Atherosclerotic heart disease of native coronary artery without angina pectoris: Secondary | ICD-10-CM | POA: Insufficient documentation

## 2023-08-18 DIAGNOSIS — I25119 Atherosclerotic heart disease of native coronary artery with unspecified angina pectoris: Secondary | ICD-10-CM

## 2023-08-18 DIAGNOSIS — Z7901 Long term (current) use of anticoagulants: Secondary | ICD-10-CM | POA: Insufficient documentation

## 2023-08-18 DIAGNOSIS — Z955 Presence of coronary angioplasty implant and graft: Secondary | ICD-10-CM | POA: Diagnosis not present

## 2023-08-18 DIAGNOSIS — D6869 Other thrombophilia: Secondary | ICD-10-CM | POA: Insufficient documentation

## 2023-08-18 DIAGNOSIS — Z79899 Other long term (current) drug therapy: Secondary | ICD-10-CM | POA: Diagnosis not present

## 2023-08-18 DIAGNOSIS — I25118 Atherosclerotic heart disease of native coronary artery with other forms of angina pectoris: Secondary | ICD-10-CM | POA: Diagnosis not present

## 2023-08-18 DIAGNOSIS — Z7982 Long term (current) use of aspirin: Secondary | ICD-10-CM | POA: Insufficient documentation

## 2023-08-18 HISTORY — PX: ATRIAL FIBRILLATION ABLATION: EP1191

## 2023-08-18 LAB — POCT ACTIVATED CLOTTING TIME: Activated Clotting Time: 406 s

## 2023-08-18 SURGERY — ATRIAL FIBRILLATION ABLATION
Anesthesia: General

## 2023-08-18 MED ORDER — PHENYLEPHRINE HCL-NACL 20-0.9 MG/250ML-% IV SOLN
INTRAVENOUS | Status: DC | PRN
  Administered 2023-08-18: 35 ug/min via INTRAVENOUS

## 2023-08-18 MED ORDER — PROPOFOL 10 MG/ML IV BOLUS
INTRAVENOUS | Status: DC | PRN
  Administered 2023-08-18: 150 mg via INTRAVENOUS

## 2023-08-18 MED ORDER — ONDANSETRON HCL 4 MG/2ML IJ SOLN
4.0000 mg | Freq: Four times a day (QID) | INTRAMUSCULAR | Status: DC | PRN
Start: 2023-08-18 — End: 2023-08-18

## 2023-08-18 MED ORDER — LACTATED RINGERS IV SOLN
INTRAVENOUS | Status: DC | PRN
Start: 2023-08-18 — End: 2023-08-18

## 2023-08-18 MED ORDER — ATROPINE SULFATE 1 MG/10ML IJ SOSY
PREFILLED_SYRINGE | INTRAMUSCULAR | Status: AC
Start: 2023-08-18 — End: ?
  Filled 2023-08-18: qty 10

## 2023-08-18 MED ORDER — FENTANYL CITRATE (PF) 250 MCG/5ML IJ SOLN
INTRAMUSCULAR | Status: DC | PRN
Start: 2023-08-18 — End: 2023-08-18
  Administered 2023-08-18: 100 ug via INTRAVENOUS

## 2023-08-18 MED ORDER — HEPARIN (PORCINE) IN NACL 1000-0.9 UT/500ML-% IV SOLN
INTRAVENOUS | Status: DC | PRN
  Administered 2023-08-18 (×3): 500 mL

## 2023-08-18 MED ORDER — SODIUM CHLORIDE 0.9 % IV SOLN: 250.0000 mL | INTRAVENOUS | Status: DC | PRN

## 2023-08-18 MED ORDER — SUGAMMADEX SODIUM 200 MG/2ML IV SOLN
INTRAVENOUS | Status: DC | PRN
  Administered 2023-08-18: 400 mg via INTRAVENOUS

## 2023-08-18 MED ORDER — ATROPINE SULFATE 1 MG/ML IV SOLN
INTRAVENOUS | Status: DC | PRN
  Administered 2023-08-18: 1 mg via INTRAVENOUS

## 2023-08-18 MED ORDER — PROTAMINE SULFATE 10 MG/ML IV SOLN
INTRAVENOUS | Status: DC | PRN
  Administered 2023-08-18: 50 mg via INTRAVENOUS

## 2023-08-18 MED ORDER — ONDANSETRON HCL 4 MG/2ML IJ SOLN
INTRAMUSCULAR | Status: DC | PRN
  Administered 2023-08-18: 4 mg via INTRAVENOUS

## 2023-08-18 MED ORDER — SODIUM CHLORIDE 0.9 % IV SOLN: INTRAVENOUS | Status: DC

## 2023-08-18 MED ORDER — ACETAMINOPHEN 325 MG PO TABS: 650.0000 mg | ORAL_TABLET | ORAL | Status: DC | PRN

## 2023-08-18 MED ORDER — HEPARIN SODIUM (PORCINE) 1000 UNIT/ML IJ SOLN
INTRAMUSCULAR | Status: DC | PRN
  Administered 2023-08-18: 17000 [IU] via INTRAVENOUS

## 2023-08-18 MED ORDER — ROCURONIUM BROMIDE 10 MG/ML (PF) SYRINGE
PREFILLED_SYRINGE | INTRAVENOUS | Status: DC | PRN
  Administered 2023-08-18: 80 mg via INTRAVENOUS
  Administered 2023-08-18: 10 mg via INTRAVENOUS

## 2023-08-18 MED ORDER — DEXAMETHASONE SODIUM PHOSPHATE 10 MG/ML IJ SOLN
INTRAMUSCULAR | Status: DC | PRN
  Administered 2023-08-18: 10 mg via INTRAVENOUS

## 2023-08-18 MED ORDER — SODIUM CHLORIDE 0.9% FLUSH: 3.0000 mL | INTRAVENOUS | Status: DC | PRN

## 2023-08-18 MED ORDER — ATROPINE SULFATE 1 MG/10ML IJ SOSY
PREFILLED_SYRINGE | INTRAMUSCULAR | Status: AC
  Filled 2023-08-18: qty 10

## 2023-08-18 MED ORDER — LIDOCAINE 2% (20 MG/ML) 5 ML SYRINGE
INTRAMUSCULAR | Status: DC | PRN
  Administered 2023-08-18: 100 mg via INTRAVENOUS

## 2023-08-18 MED ORDER — FENTANYL CITRATE (PF) 100 MCG/2ML IJ SOLN
INTRAMUSCULAR | Status: AC
  Filled 2023-08-18: qty 2

## 2023-08-18 SURGICAL SUPPLY — 22 items
BAG SNAP BAND KOVER 36X36 (MISCELLANEOUS) IMPLANT
BLANKET WARM UNDERBOD FULL ACC (MISCELLANEOUS) ×2 IMPLANT
CABLE PFA RX CATH CONN (CABLE) IMPLANT
CATH FARAWAVE ABLATION 31 (CATHETERS) IMPLANT
CATH OCTARAY 2.0 F 3-3-3-3-3 (CATHETERS) IMPLANT
CATH SOUNDSTAR ECO 8FR (CATHETERS) IMPLANT
CATH WEBSTER BI DIR CS D-F CRV (CATHETERS) IMPLANT
CLOSURE PERCLOSE PROSTYLE (VASCULAR PRODUCTS) IMPLANT
COVER SWIFTLINK CONNECTOR (BAG) ×2 IMPLANT
DEVICE CLOSURE MYNXGRIP 6/7F (Vascular Products) IMPLANT
DILATOR VESSEL 38 20CM 16FR (INTRODUCER) IMPLANT
GUIDEWIRE INQWIRE 1.5J.035X260 (WIRE) IMPLANT
INQWIRE 1.5J .035X260CM (WIRE) ×1 IMPLANT
KIT VERSACROSS CNCT FARADRIVE (KITS) IMPLANT
MAT PREVALON FULL STRYKER (MISCELLANEOUS) IMPLANT
PACK EP LF (CUSTOM PROCEDURE TRAY) ×2 IMPLANT
PAD DEFIB RADIO PHYSIO CONN (PAD) ×2 IMPLANT
PATCH CARTO3 (PAD) IMPLANT
SHEATH FARADRIVE STEERABLE (SHEATH) IMPLANT
SHEATH PINNACLE 8F 10CM (SHEATH) IMPLANT
SHEATH PINNACLE 9F 10CM (SHEATH) IMPLANT
SHEATH PROBE COVER 6X72 (BAG) IMPLANT

## 2023-08-18 NOTE — Anesthesia Procedure Notes (Signed)
 Procedure Name: Intubation Date/Time: 08/18/2023 8:06 AM  Performed by: Venia Carbon, CRNAPre-anesthesia Checklist: Patient identified, Emergency Drugs available, Suction available, Patient being monitored and Timeout performed Patient Re-evaluated:Patient Re-evaluated prior to induction Oxygen Delivery Method: Circle system utilized Preoxygenation: Pre-oxygenation with 100% oxygen Induction Type: IV induction Ventilation: Mask ventilation without difficulty Laryngoscope Size: 3 and Glidescope Grade View: Grade I Tube size: 7.0 mm Number of attempts: 1 Airway Equipment and Method: Patient positioned with wedge pillow and Video-laryngoscopy Placement Confirmation: ETT inserted through vocal cords under direct vision, positive ETCO2, CO2 detector and breath sounds checked- equal and bilateral Secured at: 23 cm Tube secured with: Tape Comments: Patient is easy mask ventilation. Grade 1 view with Glidescope 3.

## 2023-08-18 NOTE — Anesthesia Postprocedure Evaluation (Signed)
 Anesthesia Post Note  Patient: Andrew [name]  Procedure(s) Performed: ATRIAL FIBRILLATION ABLATION     Patient location during evaluation: PACU Anesthesia Type: General Level of consciousness: awake and alert Pain management: pain level controlled Vital Signs Assessment: post-procedure vital signs reviewed and stable Respiratory status: spontaneous breathing, nonlabored ventilation, respiratory function stable and patient connected to nasal cannula oxygen Cardiovascular status: blood pressure returned to baseline and stable Postop Assessment: no apparent nausea or vomiting Anesthetic complications: no   No notable events documented.  Last Vitals:  Vitals:   08/18/23 1030 08/18/23 1045  BP: 124/79 (!) 138/92  Pulse: (!) 59 60  Resp: (!) 8 12  Temp:    SpO2: 97% 100%    Last Pain:  Vitals:   08/18/23 0959  TempSrc: Temporal  PainSc:                  Mariann Barter

## 2023-08-18 NOTE — Transfer of Care (Signed)
 Immediate Anesthesia Transfer of Care Note  Patient: Andrew [name]  Procedure(s) Performed: ATRIAL FIBRILLATION ABLATION  Patient Location: Cath Lab  Anesthesia Type:General  Level of Consciousness: awake, alert , oriented, and patient cooperative  Airway & Oxygen Therapy: Patient Spontanous Breathing and Patient connected to face mask oxygen  Post-op Assessment: Report given to RN, Post -op Vital signs reviewed and stable, Patient moving all extremities, Patient moving all extremities X 4, and Patient able to stick tongue midline  Post vital signs: Reviewed and stable  Last Vitals:  Vitals Value Taken Time  BP 149/101 08/18/23 0935  Temp 35.7 C 08/18/23 0932  Pulse 64 08/18/23 0940  Resp 8 08/18/23 0940  SpO2 100 % 08/18/23 0940  Vitals shown include unfiled device data.  Last Pain:  Vitals:   08/18/23 0932  TempSrc: Temporal  PainSc: 2          Complications: No notable events documented.

## 2023-08-18 NOTE — Discharge Instructions (Signed)

## 2023-08-18 NOTE — H&P (Signed)
 Electrophysiology Office Note:    Date:  08/18/2023   ID:  Andrew [name], DOB [birth year], MRN [id number]  PCP:  Emilio Aspen, MD   [facility] Providers Cardiologist:  Thurmon Fair, MD Electrophysiologist:  Maurice Small, MD     Referring MD: No ref. provider found   History of Present Illness:    Andrew [name] is a 68 y.o. male with a medical history significant for PAF, CAD s/p PCI 2024 referred for AF management.     AF was initially detected by his FitBit, with which he was having nocturnal episodes of AF.  His primary care physician placed a monitor that showed daytime AF episodes.  Results of the monitor are detailed below.  I discussed the use of AI scribe software for clinical note transcription with the patient, who gave verbal consent to proceed.   They report that their Fitbit frequently detects episodes of AFib at night, but not during the day. The patient is currently on metoprolol and Eliquis for management of their condition. They also mention a feeling of fatigue after physical activity, but it is unclear if this is related to the AFib or another underlying condition. The patient has not reported any other symptoms related to AFib such as palpitations or shortness of breath. They have a history of coronary disease and are on daily aspirin therapy. The patient also reports a pain in their chest, but it is unclear if this is related to their AFib or another condition.     Today, he reports that he feels well and is at baseline.  I reviewed the patient's CT and labs. There was no LAA thrombus. he  has not missed any doses of anticoagulation, and he took his dose last night. There have been no changes in the patient's diagnoses, medications, or condition since our recent clinic visit.   EKGs/Labs/Other Studies Reviewed Today:     Echocardiogram:  TTE 02/2023 EF 60-65%. Normal biatrial size   Monitors:  Zio monitor in media folder --my  interpretation 9% burden of atrial fibrillation.  Occasional episodes of atrial tachycardia. Rates in AF not well-controlled, average ventricular rate 106 bpm with rates ranging to 220 bpm  Stress testing:   Advanced imaging:  Coronary CT calcium score -- 01/2023 95th percentile for age  Cardiac catherization  02/2023  Prox RCA lesion is 30% stenosed.   Mid RCA lesion is 30% stenosed.   Dist RCA lesion is 30% stenosed.   Ost Cx to Prox Cx lesion is 40% stenosed.   Mid Cx to Dist Cx lesion is 40% stenosed.   Dist LAD lesion is 20% stenosed.   Prox LAD to Mid LAD lesion is 80% stenosed.   A drug-eluting stent was successfully placed using a SYNERGY XD 2.50X20.   Post intervention, there is a 0% residual stenosis.  EKG:         Physical Exam:    VS:  BP (!) 131/91   Pulse 61   Temp 97.9 F (36.6 C) (Oral)   Resp 18   Ht 6' 2.5" (1.892 m)   Wt 93 kg   SpO2 95%   BMI 25.97 kg/m     Wt Readings from Last 3 Encounters:  08/18/23 93 kg  08/04/23 95 kg  06/16/23 91.7 kg     GEN: Well nourished, well developed in no acute distress CARDIAC: RRR, no murmurs, rubs, gallops RESPIRATORY:  Normal work of breathing MUSCULOSKELETAL: NO edema    ASSESSMENT &  PLAN:     Paroxysmal atrial fibrillation Minimal symptoms 9% burden of A-fib with moderately well-controlled rates I suspect the paucity of symptoms is due to to the short duration of his AF episodes Think he is a good candidate for rhythm control, and I recommended atrial fibrillation ablation to prevent disease progression and comorbidities.  He would like to proceed  We discussed the indication, rationale, logistics, anticipated benefits, and potential risks of the ablation procedure including but not limited to -- bleed at the groin access site, chest pain, damage to nearby organs such as the diaphragm, lungs, or esophagus, need for a drainage tube, or prolonged hospitalization. I explained that the risk for  stroke, heart attack, need for open chest surgery, or even death is very low but not zero. he  expressed understanding and wishes to proceed.   Secondary hypercoagulable state CHA2DS2-VASc score is 2 Continue apixaban 5 mg twice daily    Signed, Maurice Small, MD  08/18/2023 7:13 AM     [facility]

## 2023-08-18 NOTE — Progress Notes (Signed)
 Patient and wife was given discharge instructions. Both verbalized understanding.

## 2023-08-18 NOTE — Anesthesia Preprocedure Evaluation (Signed)
 Anesthesia Evaluation  Patient identified by MRN, date of birth, ID band Patient awake    Reviewed: Allergy & Precautions, NPO status , Patient's Chart, lab work & pertinent test results, reviewed documented beta blocker date and time   History of Anesthesia Complications (+) DIFFICULT AIRWAY and history of anesthetic complications  Airway Mallampati: IV   Neck ROM: Full  Mouth opening: Limited Mouth Opening  Dental no notable dental hx.    Pulmonary sleep apnea and Continuous Positive Airway Pressure Ventilation , neg COPD   breath sounds clear to auscultation       Cardiovascular + angina  + CAD  (-) Past MI, (-) Cardiac Stents and (-) CABG + dysrhythmias Atrial Fibrillation  Rhythm:Regular Rate:Normal     Neuro/Psych neg Seizures  Neuromuscular disease    GI/Hepatic hiatal hernia,GERD  ,,(+) neg Cirrhosis        Endo/Other    Renal/GU Renal disease     Musculoskeletal   Abdominal   Peds  Hematology   Anesthesia Other Findings   Reproductive/Obstetrics                             Anesthesia Physical Anesthesia Plan  ASA: 3  Anesthesia Plan: General   Post-op Pain Management:    Induction: Intravenous  PONV Risk Score and Plan: 2 and Ondansetron and Dexamethasone  Airway Management Planned: Oral ETT and Video Laryngoscope Planned  Additional Equipment:   Intra-op Plan:   Post-operative Plan: Extubation in OR  Informed Consent: I have reviewed the patients History and Physical, chart, labs and discussed the procedure including the risks, benefits and alternatives for the proposed anesthesia with the patient or authorized representative who has indicated his/her understanding and acceptance.     Dental advisory given  Plan Discussed with: CRNA  Anesthesia Plan Comments:        Anesthesia Quick Evaluation

## 2023-08-21 ENCOUNTER — Telehealth (HOSPITAL_COMMUNITY): Payer: Self-pay

## 2023-08-21 MED FILL — Atropine Sulfate Soln Prefill Syr 1 MG/10ML (0.1 MG/ML): INTRAMUSCULAR | Qty: 1 | Status: AC

## 2023-08-21 MED FILL — Fentanyl Citrate Preservative Free (PF) Inj 100 MCG/2ML: INTRAMUSCULAR | Qty: 2 | Status: AC

## 2023-08-21 NOTE — Telephone Encounter (Signed)
 Spoke with patient to complete post procedure follow up call.  Patient removed large bandage at puncture sites after 24 hours and reports no complications with groin sites.   Instructions reviewed with patient:  Remove large bandage at puncture site after 24 hours. It is normal to have bruising, tenderness and a pea or marble sized lump/knot at the groin site which can take up to three months to resolve.  Get help right away if you notice sudden swelling at the puncture site.  Check your puncture site every day for signs of infection: fever, redness, swelling, pus drainage, warmth, foul odor or excessive pain. If this occurs, please call the office at [phone], to speak with the nurse. Get help right away if your puncture site is bleeding and the bleeding does not stop after applying firm pressure to the area.  You may continue to have skipped beats/ atrial fibrillation during the first several months after your procedure.  It is very important not to miss any doses of your blood thinner Eliquis. Patient restarted taking this medication on 08/18/23.   You will follow up with the Afib clinic on 09/15/23 and follow up with the APP on 11/17/23.   Patient verbalized understanding to all instructions provided.

## 2023-08-26 ENCOUNTER — Other Ambulatory Visit (HOSPITAL_COMMUNITY): Payer: Self-pay

## 2023-08-28 ENCOUNTER — Other Ambulatory Visit (HOSPITAL_COMMUNITY): Payer: Self-pay

## 2023-09-15 ENCOUNTER — Encounter (HOSPITAL_COMMUNITY): Payer: Self-pay | Admitting: Physician Assistant

## 2023-09-15 ENCOUNTER — Ambulatory Visit (HOSPITAL_COMMUNITY)
Admission: RE | Admit: 2023-09-15 | Discharge: 2023-09-15 | Disposition: A | Discharge status: Home / Self Care | Source: Ambulatory Visit | Attending: Physician Assistant | Admitting: Physician Assistant

## 2023-09-15 VITALS — BP 130/90 | HR 63 | Ht 74.5 in | Wt 208.8 lb

## 2023-09-15 DIAGNOSIS — Z79899 Other long term (current) drug therapy: Secondary | ICD-10-CM | POA: Insufficient documentation

## 2023-09-15 DIAGNOSIS — I2510 Atherosclerotic heart disease of native coronary artery without angina pectoris: Secondary | ICD-10-CM | POA: Diagnosis not present

## 2023-09-15 DIAGNOSIS — I480 Paroxysmal atrial fibrillation: Secondary | ICD-10-CM | POA: Diagnosis not present

## 2023-09-15 DIAGNOSIS — E785 Hyperlipidemia, unspecified: Secondary | ICD-10-CM | POA: Diagnosis not present

## 2023-09-15 DIAGNOSIS — G4733 Obstructive sleep apnea (adult) (pediatric): Secondary | ICD-10-CM | POA: Diagnosis not present

## 2023-09-15 DIAGNOSIS — D6869 Other thrombophilia: Secondary | ICD-10-CM | POA: Diagnosis not present

## 2023-09-15 DIAGNOSIS — Z7901 Long term (current) use of anticoagulants: Secondary | ICD-10-CM | POA: Diagnosis not present

## 2023-09-15 NOTE — Progress Notes (Signed)
 Primary Care Physician: Benedetta Bradley, MD Primary Cardiologist: Luana Rumple, MD Electrophysiologist: Efraim Grange, MD  Referring Physician: Dr Sharron Deeds is a 68 y.o. male with a history of CAD, HLD, OSA, atrial fibrillation who presents for follow up in the [facility]. AF was initially detected by his FitBit, with which he was having nocturnal episodes of AF.  His primary care physician placed a monitor that showed daytime AF episodes, 9% burden. Patient is s/p afib ablation with Dr Arlester Ladd on 08/18/23. Patient is on Eliquis  for stroke prevention.   Patient presents today for follow up for atrial fibrillation. He reports that he has done well since the ablation. He is in SR today. He has not had any afib alerts on his smart watch. He denies chest pain or groin issues.   Today, he denies symptoms of palpitations, chest pain, shortness of breath, orthopnea, PND, lower extremity edema, dizziness, presyncope, syncope, bleeding, or neurologic sequela. The patient is tolerating medications without difficulties and is otherwise without complaint today.    Atrial Fibrillation Risk Factors:  he does have symptoms or diagnosis of sleep apnea. he does not have a history of rheumatic fever.   Atrial Fibrillation Management history:  Previous antiarrhythmic drugs: none Previous cardioversions: none Previous ablations: 08/18/23 Anticoagulation history: Eliquis   ROS- All systems are reviewed and negative except as per the HPI above.  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 Take 1 hour prior to dental procedures     apixaban  (ELIQUIS ) 5 MG TABS tablet Take 1 tablet (5 mg total) by mouth 2 (two) times daily.     ascorbic acid (VITAMIN C) 1000 MG tablet Take 1,000 mg by mouth 2 (two) times daily.     atorvastatin  (LIPITOR) 40 MG tablet Take 40 mg by mouth daily.     cetirizine (ZYRTEC) 10 MG tablet Take 10 mg by mouth daily as needed for allergies.     Cholecalciferol (VITAMIN D3) 125 MCG (5000 UT) CAPS Take 5,000 Units by mouth 3 (three) times a week. Tues, Thurs, and Sat     clopidogrel  (PLAVIX ) 75 MG tablet Take 1 tablet (75 mg total) by mouth daily. 90 tablet 3   Cyanocobalamin  (B-12) 2500 MCG TABS Take 2,500 mcg by mouth daily.     doxycycline (ADOXA) 100 MG tablet Take 100 mg by mouth daily as needed (Rosacea).     ezetimibe  (ZETIA ) 10 MG tablet Take 1 tablet (10 mg total) by mouth daily. 90 tablet 3   fluticasone (FLONASE) 50 MCG/ACT nasal spray Place 2 sprays into both nostrils daily as needed for allergies or rhinitis.     L-Lysine 1000 MG TABS Take 500 mg by mouth 2 (two) times daily.     loratadine-pseudoephedrine (CLARITIN-D 12 HOUR) 5-120 MG tablet Take 1 tablet by mouth daily as needed for allergies.     Melatonin 10 MG CAPS Take 10 mg by mouth at bedtime.     metoprolol  succinate (  TOPROL  XL) 25 MG 24 hr tablet Take 1 tablet (25 mg total) by mouth daily. 90 tablet 3   Multiple Vitamins-Minerals (CENTRUM SILVER PO) Take 1 tablet by mouth daily.     Multiple Vitamins-Minerals (PRESERVISION AREDS 2+MULTI VIT PO) Take 1 tablet by mouth 2 (two) times daily.     nitroGLYCERIN  (NITROSTAT ) 0.4 MG SL tablet Place 1 tablet (0.4 mg total) under the tongue every 5 (five) minutes as needed for chest pain. 25 tablet 3   Omega-3 Fatty Acids (FISH OIL) 1200 MG CAPS Take 1,200 mg by mouth 2 (two) times daily.     pantoprazole  (PROTONIX ) 40 MG tablet Take 1 tablet (40 mg total) by mouth every Monday, Wednesday, and  Friday. 30 tablet 4   traZODone (DESYREL) 50 MG tablet Take 50-100 mg by mouth at bedtime as needed for sleep.     tretinoin (RETIN-A) 0.1 % cream Apply 1 Application topically at bedtime as needed (rosacea).     No current facility-administered medications for this encounter.    Physical Exam: BP (!) 130/90   Pulse 63   Ht 6' 2.5" (1.892 m)   Wt 94.7 kg   BMI 26.45 kg/m   GEN: Well nourished, well developed in no acute distress CARDIAC: Regular rate and rhythm, no murmurs, rubs, gallops RESPIRATORY:  Clear to auscultation without rales, wheezing or rhonchi  ABDOMEN: Soft, non-tender, non-distended EXTREMITIES:  No edema; No deformity   Wt Readings from Last 3 Encounters:  09/15/23 94.7 kg  08/18/23 93 kg  08/04/23 95 kg     EKG today demonstrates  SR Vent. rate 63 BPM PR interval 182 ms QRS duration 100 ms QT/QTcB 372/380 ms   Echo 03/07/23 demonstrated   1. Left ventricular ejection fraction, by estimation, is 60 to 65%. Left  ventricular ejection fraction by 3D volume is 59 %. The left ventricle has  normal function. The left ventricle has no regional wall motion  abnormalities. Left ventricular diastolic parameters were normal.   2. Right ventricular systolic function is normal. The right ventricular  size is normal. There is normal pulmonary artery systolic pressure.   3. The mitral valve is normal in structure. No evidence of mitral valve  regurgitation.   4. The aortic valve is normal in structure. Aortic valve regurgitation is  mild.   5. Aortic dilatation noted. There is mild dilatation of the aortic root,  measuring 40 mm.    CHA2DS2-VASc Score = 2  The patient's score is based upon: CHF History: 0 HTN History: 0 Diabetes History: 0 Stroke History: 0 Vascular Disease History: 1 Age Score: 1 Gender Score: 0       ASSESSMENT AND PLAN: Paroxysmal Atrial Fibrillation (ICD10:  I48.0) The patient's CHA2DS2-VASc score is 2, indicating a 2.2% annual  risk of stroke.   S/p afib ablation 08/18/23 Patient appears to be maintaining SR Continue Eliquis  5 mg BID with no missed doses for 3 months post ablation.  Continue Toprol  25 mg daily Fitbit for home monitoring.   Secondary Hypercoagulable State (ICD10:  D68.69) The patient is at significant risk for stroke/thromboembolism based upon his CHA2DS2-VASc Score of 2.  Continue Apixaban  (Eliquis ). No bleeding issues.   CAD No anginal symptoms Followed by Dr Alvis Ba   OSA  Encouraged nightly CPAP Followed by Dr Micael Adas    Follow up with Mertha Abrahams as scheduled.        Myrtha Ates PA-C [facility] [address] [place], [place] [address] [phone]

## 2023-10-13 DIAGNOSIS — M722 Plantar fascial fibromatosis: Secondary | ICD-10-CM | POA: Diagnosis not present

## 2023-10-13 DIAGNOSIS — M216X1 Other acquired deformities of right foot: Secondary | ICD-10-CM | POA: Diagnosis not present

## 2023-10-13 DIAGNOSIS — M9905 Segmental and somatic dysfunction of pelvic region: Secondary | ICD-10-CM | POA: Diagnosis not present

## 2023-10-13 DIAGNOSIS — M9906 Segmental and somatic dysfunction of lower extremity: Secondary | ICD-10-CM | POA: Diagnosis not present

## 2023-10-13 DIAGNOSIS — M2040 Other hammer toe(s) (acquired), unspecified foot: Secondary | ICD-10-CM | POA: Diagnosis not present

## 2023-10-13 DIAGNOSIS — G609 Hereditary and idiopathic neuropathy, unspecified: Secondary | ICD-10-CM | POA: Diagnosis not present

## 2023-10-13 DIAGNOSIS — M25571 Pain in right ankle and joints of right foot: Secondary | ICD-10-CM | POA: Diagnosis not present

## 2023-10-13 DIAGNOSIS — M9904 Segmental and somatic dysfunction of sacral region: Secondary | ICD-10-CM | POA: Diagnosis not present

## 2023-10-26 ENCOUNTER — Telehealth: Payer: Self-pay

## 2023-10-26 NOTE — Telephone Encounter (Signed)
**Note De-Identified Andrew [name] Obfuscation** Per the [employer] provider portal this Split Night Sleep Study PA has been approved from 10/25/2023-01/23/2024. Authorization #: [id number]  I have transferred the order to the sleep lab so they can contact the pt to schedule the test.

## 2023-11-01 DIAGNOSIS — M542 Cervicalgia: Secondary | ICD-10-CM | POA: Diagnosis not present

## 2023-11-01 DIAGNOSIS — M216X1 Other acquired deformities of right foot: Secondary | ICD-10-CM | POA: Diagnosis not present

## 2023-11-01 DIAGNOSIS — M9904 Segmental and somatic dysfunction of sacral region: Secondary | ICD-10-CM | POA: Diagnosis not present

## 2023-11-01 DIAGNOSIS — M545 Low back pain, unspecified: Secondary | ICD-10-CM | POA: Diagnosis not present

## 2023-11-01 DIAGNOSIS — M9906 Segmental and somatic dysfunction of lower extremity: Secondary | ICD-10-CM | POA: Diagnosis not present

## 2023-11-01 DIAGNOSIS — M19071 Primary osteoarthritis, right ankle and foot: Secondary | ICD-10-CM | POA: Diagnosis not present

## 2023-11-01 DIAGNOSIS — M9903 Segmental and somatic dysfunction of lumbar region: Secondary | ICD-10-CM | POA: Diagnosis not present

## 2023-11-01 DIAGNOSIS — M4696 Unspecified inflammatory spondylopathy, lumbar region: Secondary | ICD-10-CM | POA: Diagnosis not present

## 2023-11-01 DIAGNOSIS — M9905 Segmental and somatic dysfunction of pelvic region: Secondary | ICD-10-CM | POA: Diagnosis not present

## 2023-11-01 DIAGNOSIS — M4692 Unspecified inflammatory spondylopathy, cervical region: Secondary | ICD-10-CM | POA: Diagnosis not present

## 2023-11-01 DIAGNOSIS — M9901 Segmental and somatic dysfunction of cervical region: Secondary | ICD-10-CM | POA: Diagnosis not present

## 2023-11-17 ENCOUNTER — Ambulatory Visit: Attending: Physician Assistant | Admitting: Physician Assistant

## 2023-11-17 VITALS — BP 134/82 | HR 69 | Ht 74.0 in | Wt 211.0 lb

## 2023-11-17 DIAGNOSIS — I2510 Atherosclerotic heart disease of native coronary artery without angina pectoris: Secondary | ICD-10-CM | POA: Diagnosis not present

## 2023-11-17 DIAGNOSIS — I480 Paroxysmal atrial fibrillation: Secondary | ICD-10-CM | POA: Diagnosis not present

## 2023-11-17 DIAGNOSIS — D6869 Other thrombophilia: Secondary | ICD-10-CM | POA: Diagnosis not present

## 2023-11-17 NOTE — Patient Instructions (Signed)
 Medication Instructions:   Your physician recommends that you continue on your current medications as directed. Please refer to the Current Medication list given to you today.   *If you need a refill on your cardiac medications before your next appointment, please call your pharmacy*   Lab Work:  NONE ORDERED  TODAY     If you have labs (blood work) drawn today and your tests are completely normal, you will receive your results only by: MyChart Message (if you have MyChart) OR A paper copy in the mail If you have any lab test that is abnormal or we need to change your treatment, we will call you to review the results.    Testing/Procedures: NONE ORDERED  TODAY   Follow-Up: At [facility], you and your health needs are our priority.  As part of our continuing mission to provide you with exceptional heart care, our providers are all part of one team.  This team includes your primary Cardiologist (physician) and Advanced Practice Providers or APPs (Physician Assistants and Nurse Practitioners) who all work together to provide you with the care you need, when you need it.  Your next appointment:  Dr. Bertha Broad in October                                     AND    6 month(s)  You may see Efraim Grange, MD or  Mertha Abrahams, PA-C   We recommend signing up for the patient portal called MyChart.  Sign up information is provided on this After Visit Summary.  MyChart is used to connect with patients for Virtual Visits (Telemedicine).  Patients are able to view lab/test results, encounter notes, upcoming appointments, etc.  Non-urgent messages can be sent to your provider as well.   To learn more about what you can do with MyChart, go to [email or web address].   Other Instructions

## 2023-11-17 NOTE — Progress Notes (Signed)
 Cardiology Office Note:  .   Date:  11/17/2023  ID:  Andrew [name], DOB 09-10-1955, MRN [id number] PCP: Benedetta Bradley, MD  [facility] Providers Cardiologist:  Luana Rumple, MD Electrophysiologist:  Efraim Grange, MD {  History of Present Illness: Andrew [name]   Andrew [name] is a 68 y.o. 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 Arlester Ladd 06/12/23 for management of his AFib, planned for ablation  He saw Dr. Tita Form, 06/16/23, also mentions a Atach c/w PV tachycardia, planned for brief interruption his Pavix per-procedure  08/18/23: ablation  Saw the AFib clinic team 09/15/23, no symptoms of AFib, no procedural concerns, no watch alerts  Today's visit is scheduled as his post ablation visit ROS:   He is doing very well Was not particularly aware of his AFib, but has not had any symptoms of CP, palpitations or cardiac awareness  No SOB No near syncope or syncope No bleeding or signs of bleeding  Questions on duration of his Plavix  > deferred to Dr. Alvis Ba   Arrhythmia/AAD hx AFib found via his fit bit 2024 08/18/23: AFib ablation  Studies Reviewed: Andrew [name]    EKG done today and reviewed by myself:  SR 69bpm, baseline motion  08/18/23: EPS/ablation CONCLUSIONS: 1. Sinus rhythm upon presentation.   2. Successful ablation of all four pulmonary veins with pulsed field energy. 3. Successful ablation of the posterior wall of the left atrium with pulsed field energy 4. No inducible arrhythmias following ablation  5. No early apparent complications.  TTE 02/2023 EF 60-65%. Normal biatrial size   Zio  monitor in media folder --my interpretation 9% burden of atrial fibrillation.  Occasional episodes of atrial tachycardia. Rates in AF not well-controlled, average ventricular rate 106 bpm with rates ranging to 220 bpm   Cardiac catherization   02/2023  Prox RCA lesion is 30% stenosed.   Mid RCA lesion is 30% stenosed.   Dist RCA lesion is 30% stenosed.   Ost Cx to Prox Cx lesion is 40% stenosed.   Mid Cx to Dist Cx lesion is 40% stenosed.   Dist LAD lesion is 20% stenosed.   Prox LAD to Mid LAD lesion is 80% stenosed.   A drug-eluting stent was successfully placed using a SYNERGY XD 2.50X20.   Post intervention, there is a 0% residual stenosis.   Risk Assessment/Calculations:    Physical Exam:   VS:  There were no vitals taken for this visit.   Wt Readings from Last 3 Encounters:  09/15/23 208 lb 12.8 oz (94.7 kg)  08/18/23 205 lb (93 kg)  08/04/23 209 lb 6.4 oz (95 kg)    GEN: Well nourished, well developed in no acute distress NECK: No JVD; No carotid bruits CARDIAC: RRR, no murmurs, rubs, gallops RESPIRATORY:  CTA b/l without rales, wheezing or rhonchi  ABDOMEN: Soft, non-tender, non-distended EXTREMITIES: No edema; No deformity   ASSESSMENT AND PLAN: .    paroxysmal AFib CHA2DS2Vasc is 2, on Eliquis , appropriately dosed Discussed life long a/c No symptoms to suggest AFib, no ongoing fit bit alerts, previously was fairly constant alerts to widely variable HRs No physical/exercise restrictions for  post procedural/Afib perspective  CAD No symptoms C/w Dr. Lord Rod  Secondary hypercoagulable state 2/2 AFib   Dispo: due to see Dr. Tita Form ~ Oct, back with EP again in 17mo, sooner if needed  Signed, [name], PA-C

## 2023-11-24 DIAGNOSIS — M25571 Pain in right ankle and joints of right foot: Secondary | ICD-10-CM | POA: Diagnosis not present

## 2023-11-24 DIAGNOSIS — G609 Hereditary and idiopathic neuropathy, unspecified: Secondary | ICD-10-CM | POA: Diagnosis not present

## 2023-11-24 DIAGNOSIS — M545 Low back pain, unspecified: Secondary | ICD-10-CM | POA: Diagnosis not present

## 2023-11-24 DIAGNOSIS — M9905 Segmental and somatic dysfunction of pelvic region: Secondary | ICD-10-CM | POA: Diagnosis not present

## 2023-11-24 DIAGNOSIS — M19071 Primary osteoarthritis, right ankle and foot: Secondary | ICD-10-CM | POA: Diagnosis not present

## 2023-11-24 DIAGNOSIS — M9906 Segmental and somatic dysfunction of lower extremity: Secondary | ICD-10-CM | POA: Diagnosis not present

## 2023-11-24 DIAGNOSIS — M542 Cervicalgia: Secondary | ICD-10-CM | POA: Diagnosis not present

## 2023-11-24 DIAGNOSIS — M2040 Other hammer toe(s) (acquired), unspecified foot: Secondary | ICD-10-CM | POA: Diagnosis not present

## 2023-11-24 DIAGNOSIS — M9904 Segmental and somatic dysfunction of sacral region: Secondary | ICD-10-CM | POA: Diagnosis not present

## 2023-11-25 ENCOUNTER — Encounter: Payer: Self-pay | Admitting: Cardiovascular Disease

## 2023-12-24 ENCOUNTER — Other Ambulatory Visit: Payer: Self-pay | Admitting: Cardiovascular Disease

## 2023-12-25 DIAGNOSIS — G4733 Obstructive sleep apnea (adult) (pediatric): Secondary | ICD-10-CM | POA: Diagnosis not present

## 2023-12-25 DIAGNOSIS — K219 Gastro-esophageal reflux disease without esophagitis: Secondary | ICD-10-CM | POA: Diagnosis not present

## 2023-12-25 DIAGNOSIS — Z0000 Encounter for general adult medical examination without abnormal findings: Secondary | ICD-10-CM | POA: Diagnosis not present

## 2023-12-25 DIAGNOSIS — I480 Paroxysmal atrial fibrillation: Secondary | ICD-10-CM | POA: Diagnosis not present

## 2023-12-25 DIAGNOSIS — Z79899 Other long term (current) drug therapy: Secondary | ICD-10-CM | POA: Diagnosis not present

## 2023-12-25 DIAGNOSIS — K9089 Other intestinal malabsorption: Secondary | ICD-10-CM | POA: Diagnosis not present

## 2023-12-25 DIAGNOSIS — F5101 Primary insomnia: Secondary | ICD-10-CM | POA: Diagnosis not present

## 2023-12-25 DIAGNOSIS — I2510 Atherosclerotic heart disease of native coronary artery without angina pectoris: Secondary | ICD-10-CM | POA: Diagnosis not present

## 2023-12-25 DIAGNOSIS — N529 Male erectile dysfunction, unspecified: Secondary | ICD-10-CM | POA: Diagnosis not present

## 2023-12-25 DIAGNOSIS — E782 Mixed hyperlipidemia: Secondary | ICD-10-CM | POA: Diagnosis not present

## 2023-12-25 DIAGNOSIS — R0789 Other chest pain: Secondary | ICD-10-CM | POA: Diagnosis not present

## 2023-12-25 NOTE — Telephone Encounter (Signed)
 Prescription refill request for Eliquis  received. Indication:AFIB Last office visit:6/25 Scr:1.26  3/25 Age: 68 Weight:95.7  kg  Prescription refilled

## 2023-12-27 ENCOUNTER — Ambulatory Visit: Payer: Self-pay | Admitting: Cardiovascular Disease

## 2023-12-27 DIAGNOSIS — Z79899 Other long term (current) drug therapy: Secondary | ICD-10-CM | POA: Diagnosis not present

## 2023-12-27 DIAGNOSIS — E782 Mixed hyperlipidemia: Secondary | ICD-10-CM | POA: Diagnosis not present

## 2023-12-27 DIAGNOSIS — K9089 Other intestinal malabsorption: Secondary | ICD-10-CM | POA: Diagnosis not present

## 2023-12-27 LAB — LAB REPORT - SCANNED: EGFR: 70

## 2023-12-28 MED ORDER — ATORVASTATIN CALCIUM 80 MG PO TABS: 80.0000 mg | ORAL_TABLET | Freq: Every day | ORAL | 3 refills | Status: AC

## 2024-01-04 ENCOUNTER — Ambulatory Visit (HOSPITAL_BASED_OUTPATIENT_CLINIC_OR_DEPARTMENT_OTHER): Attending: Cardiology | Admitting: Cardiology

## 2024-01-04 DIAGNOSIS — G4733 Obstructive sleep apnea (adult) (pediatric): Secondary | ICD-10-CM

## 2024-01-04 DIAGNOSIS — Z0389 Encounter for observation for other suspected diseases and conditions ruled out: Secondary | ICD-10-CM | POA: Insufficient documentation

## 2024-01-05 NOTE — Procedures (Signed)
  Indications for Polysomnography The patient is a 68 year old Male who is 6' 2 and weighs 207.0 lbs. His BMI equals 26.6.  A full night polysomnogram was performed to evaluate for Obstructive Sleep Apnea.  Medication was taken at 9:15 pm.MelatoninTrazodone Polysomnogram Data A full night polysomnogram recorded the standard physiologic parameters including EEG, EOG, EMG, EKG, nasal and oral airflow.  Respiratory parameters of chest and abdominal movements were recorded with Respiratory Inductance Plethysmography belts.   Oxygen saturation was recorded by pulse oximetry.  Sleep Architecture The total recording time of the polysomnogram was 423.1 minutes.  The total sleep time was 353.5 minutes.  The patient spent 1.0% of total sleep time in Stage N1, 26.7% in Stage N2, 33.4% in Stages N3, and 38.9% in REM.  Sleep latency was 11.9 minutes.   REM latency was 82.5 minutes.  Sleep Efficiency was 83.5%.  Wake after Sleep Onset time was 57.5 minutes.  Respiratory Events The polysomnogram revealed a presence of 11 obstructive, 0 central, and 0 mixed apneas resulting in an Apnea index of 1.9 events per hour.  There were 25 hypopneas (GreaterEqual to3% desaturation and/or arousal) resulting in an Apnea\Hypopnea Index (AHI  GreaterEqual to3% desaturation and/or arousal) of 6.1 events per hour.  There were 10 hypopneas (GreaterEqual to4% desaturation) resulting in an Apnea\Hypopnea Index (AHI GreaterEqual to4% desaturation) of 3.6 events per hour.  There were 17 Respiratory  Effort Related Arousals resulting in a RERA index of 2.9 events per hour. The Respiratory Disturbance Index is 9.0 events per hour.  The snore index was 0 events per hour.  Mean oxygen saturation was 95.0%.  The lowest oxygen saturation during sleep was 90.0%.  Time spent LessEqual to88% oxygen saturation was  minutes.  Limb Activity There were 326 total limb movements recorded, of this total, 323 were classified as PLMs.  PLM index was  54.8 per hour and PLM associated with Arousals index was 0.8 per hour.  Cardiac Summary The average pulse rate was 50.7 bpm.  The minimum pulse rate was 28.0 bpm while the maximum pulse rate was 88.0 bpm.  Cardiac rhythm was normal with PVCs  Diagnosis: Normal Study  Recommendations: 1. The patient should be counseled to practice good sleep hygiene and avoid sleeping in the supine position. 2. Encourage patient to avoid driving when sleepy. 3. Consider referral to ENT for any problems with snoring.   This study was personally reviewed and electronically signed by: Shlomo Corning, Md Accredited Board Certified in Sleep Medicine Date/Time: 01/05/2024 10:14AM

## 2024-01-15 ENCOUNTER — Telehealth: Payer: Self-pay | Admitting: *Deleted

## 2024-01-15 DIAGNOSIS — R0683 Snoring: Secondary | ICD-10-CM

## 2024-01-15 NOTE — Telephone Encounter (Signed)
-----   Message from Wilbert Bihari sent at 01/05/2024 10:16 AM EDT ----- Please let patient know that sleep study showed no significant sleep apnea.  No indication for CPAP at this time

## 2024-01-15 NOTE — Telephone Encounter (Signed)
 The patient has been notified of the result and verbalized understanding.  All questions (if any) were answered. Joshua Dalton Seip, CMA 01/15/2024 5:43 PM    Pt is agreeable to normal results but would like to know if there are any other options to severe snoring. He does sleep with a cpap at night.

## 2024-01-25 NOTE — Telephone Encounter (Signed)
 Referral to Dr. Carlie placed, [facility] to patient.

## 2024-02-08 ENCOUNTER — Other Ambulatory Visit: Payer: Self-pay | Admitting: Cardiovascular Disease

## 2024-02-20 DIAGNOSIS — L905 Scar conditions and fibrosis of skin: Secondary | ICD-10-CM | POA: Diagnosis not present

## 2024-02-20 DIAGNOSIS — L821 Other seborrheic keratosis: Secondary | ICD-10-CM | POA: Diagnosis not present

## 2024-02-20 DIAGNOSIS — Z85828 Personal history of other malignant neoplasm of skin: Secondary | ICD-10-CM | POA: Diagnosis not present

## 2024-02-20 DIAGNOSIS — L570 Actinic keratosis: Secondary | ICD-10-CM | POA: Diagnosis not present

## 2024-02-20 DIAGNOSIS — D225 Melanocytic nevi of trunk: Secondary | ICD-10-CM | POA: Diagnosis not present

## 2024-02-20 DIAGNOSIS — B078 Other viral warts: Secondary | ICD-10-CM | POA: Diagnosis not present

## 2024-02-27 ENCOUNTER — Other Ambulatory Visit (HOSPITAL_COMMUNITY): Payer: Self-pay

## 2024-03-22 ENCOUNTER — Telehealth: Payer: Self-pay | Admitting: Cardiovascular Disease

## 2024-03-22 NOTE — Telephone Encounter (Signed)
 It is time to stop clopidogrel , 12 months after PCI. I spoke to Andrew [name].

## 2024-03-22 NOTE — Telephone Encounter (Signed)
 Spoke with pt regarding Plavix . Pt stated his prescription ran out a couple days ago but he was unsure if he was supposed to continue taking it. Pt is having no issues and no bleeding. Pt stated he has 0 problems taking it and 0 problems stopping it and will do whatever the doctor tells him. [place], PA-C had deferred the questions about Plavix  to Dr. Francyne in June 2025. Will route to Dr. Francyne for his advise. Pt verbalized understanding. All questions if any were answered.

## 2024-03-22 NOTE — Telephone Encounter (Signed)
 Pt c/o medication issue:  1. Name of Medication: clopidogrel  (PLAVIX ) 75 MG tablet (Expired)   2. How are you currently taking this medication (dosage and times per day)? Take 1 tablet (75 mg total) by mouth daily. Dispense: 90 tablet,   3. Are you having a reaction (difficulty breathing--STAT)? No  4. What is your medication issue? Pt is requesting a callback regarding him wanting to know if he should still be on this medication. Please advise

## 2024-05-08 ENCOUNTER — Emergency Department (HOSPITAL_BASED_OUTPATIENT_CLINIC_OR_DEPARTMENT_OTHER): Admitting: Radiology

## 2024-05-08 ENCOUNTER — Encounter (HOSPITAL_BASED_OUTPATIENT_CLINIC_OR_DEPARTMENT_OTHER): Payer: Self-pay | Admitting: Emergency Medicine

## 2024-05-08 ENCOUNTER — Other Ambulatory Visit: Payer: Self-pay

## 2024-05-08 ENCOUNTER — Inpatient Hospital Stay (HOSPITAL_BASED_OUTPATIENT_CLINIC_OR_DEPARTMENT_OTHER)
Admission: EM | Admit: 2024-05-08 | Discharge: 2024-05-12 | DRG: 419 | Disposition: A | Attending: Internal Medicine | Admitting: Internal Medicine

## 2024-05-08 ENCOUNTER — Emergency Department (HOSPITAL_BASED_OUTPATIENT_CLINIC_OR_DEPARTMENT_OTHER)

## 2024-05-08 DIAGNOSIS — K810 Acute cholecystitis: Secondary | ICD-10-CM | POA: Diagnosis not present

## 2024-05-08 DIAGNOSIS — K828 Other specified diseases of gallbladder: Secondary | ICD-10-CM | POA: Diagnosis not present

## 2024-05-08 DIAGNOSIS — I77810 Thoracic aortic ectasia: Secondary | ICD-10-CM | POA: Diagnosis not present

## 2024-05-08 DIAGNOSIS — R109 Unspecified abdominal pain: Secondary | ICD-10-CM | POA: Diagnosis not present

## 2024-05-08 DIAGNOSIS — K449 Diaphragmatic hernia without obstruction or gangrene: Secondary | ICD-10-CM | POA: Diagnosis not present

## 2024-05-08 DIAGNOSIS — R1010 Upper abdominal pain, unspecified: Secondary | ICD-10-CM | POA: Diagnosis not present

## 2024-05-08 DIAGNOSIS — R059 Cough, unspecified: Secondary | ICD-10-CM | POA: Diagnosis not present

## 2024-05-08 DIAGNOSIS — N281 Cyst of kidney, acquired: Secondary | ICD-10-CM | POA: Diagnosis not present

## 2024-05-08 LAB — URINALYSIS, ROUTINE W REFLEX MICROSCOPIC
Bacteria, UA: NONE SEEN
Bilirubin Urine: NEGATIVE
Glucose, UA: NEGATIVE mg/dL
Hgb urine dipstick: NEGATIVE
Ketones, ur: 15 mg/dL — AB
Leukocytes,Ua: NEGATIVE
Nitrite: NEGATIVE
Protein, ur: 30 mg/dL — AB
Specific Gravity, Urine: 1.021 (ref 1.005–1.030)
pH: 5.5 (ref 5.0–8.0)

## 2024-05-08 LAB — COMPREHENSIVE METABOLIC PANEL WITH GFR
ALT: 27 U/L (ref 0–44)
AST: 40 U/L (ref 15–41)
Albumin: 4.1 g/dL (ref 3.5–5.0)
Alkaline Phosphatase: 122 U/L (ref 38–126)
Anion gap: 15 (ref 5–15)
BUN: 13 mg/dL (ref 8–23)
CO2: 23 mmol/L (ref 22–32)
Calcium: 9.1 mg/dL (ref 8.9–10.3)
Chloride: 94 mmol/L — ABNORMAL LOW (ref 98–111)
Creatinine, Ser: 1.14 mg/dL (ref 0.61–1.24)
GFR, Estimated: >60 mL/min (ref >60)
Glucose, Bld: 115 mg/dL — ABNORMAL HIGH (ref 70–99)
Potassium: 4.3 mmol/L (ref 3.5–5.1)
Sodium: 131 mmol/L — ABNORMAL LOW (ref 135–145)
Total Bilirubin: 1.8 mg/dL — ABNORMAL HIGH (ref 0.0–1.2)
Total Protein: 7.3 g/dL (ref 6.5–8.1)

## 2024-05-08 LAB — RESP PANEL BY RT-PCR (RSV, FLU A&B, COVID)  RVPGX2
Influenza A by PCR: NEGATIVE
Influenza B by PCR: NEGATIVE
Resp Syncytial Virus by PCR: NEGATIVE
SARS Coronavirus 2 by RT PCR: NEGATIVE

## 2024-05-08 LAB — CBC
HCT: 42.3 % (ref 39.0–52.0)
Hemoglobin: 14.9 g/dL (ref 13.0–17.0)
MCH: 30.7 pg (ref 26.0–34.0)
MCHC: 35.2 g/dL (ref 30.0–36.0)
MCV: 87.2 fL (ref 80.0–100.0)
Platelets: 175 K/uL (ref 150–400)
RBC: 4.85 MIL/uL (ref 4.22–5.81)
RDW: 13.5 % (ref 11.5–15.5)
WBC: 10.4 K/uL (ref 4.0–10.5)
nRBC: 0 % (ref 0.0–0.2)

## 2024-05-08 LAB — LIPASE, BLOOD: Lipase: 19 U/L (ref 11–51)

## 2024-05-08 LAB — LACTIC ACID, PLASMA: Lactic Acid, Venous: 1 mmol/L (ref 0.5–1.9)

## 2024-05-08 LAB — OCCULT BLOOD X 1 CARD TO LAB, STOOL: Fecal Occult Bld: NEGATIVE

## 2024-05-08 MED ORDER — PIPERACILLIN-TAZOBACTAM 3.375 G IVPB 30 MIN
3.3750 g | Freq: Once | INTRAVENOUS | Status: AC
  Administered 2024-05-08: 3.375 g via INTRAVENOUS
  Filled 2024-05-08: qty 50

## 2024-05-08 MED ORDER — MORPHINE SULFATE (PF) 4 MG/ML IV SOLN
4.0000 mg | Freq: Once | INTRAVENOUS | Status: AC
  Administered 2024-05-08: 4 mg via INTRAVENOUS
  Filled 2024-05-08: qty 1

## 2024-05-08 MED ORDER — TRAZODONE HCL 100 MG PO TABS
100.0000 mg | ORAL_TABLET | Freq: Every day | ORAL | Status: AC
  Administered 2024-05-08: 100 mg via ORAL
  Filled 2024-05-08: qty 1

## 2024-05-08 MED ORDER — HYDROMORPHONE HCL 1 MG/ML IJ SOLN: 0.5000 mg | INTRAMUSCULAR | Status: DC | PRN

## 2024-05-08 MED ORDER — OXYCODONE HCL 5 MG PO TABS
5.0000 mg | ORAL_TABLET | ORAL | Status: DC | PRN
  Administered 2024-05-08: 5 mg via ORAL
  Administered 2024-05-09: 10 mg via ORAL
  Filled 2024-05-08: qty 1
  Filled 2024-05-08: qty 2

## 2024-05-08 MED ORDER — MAGNESIUM HYDROXIDE 400 MG/5ML PO SUSP
15.0000 mL | Freq: Once | ORAL | Status: AC
  Administered 2024-05-08: 15 mL via ORAL
  Filled 2024-05-08: qty 30

## 2024-05-08 MED ORDER — IOHEXOL 300 MG/ML  SOLN
100.0000 mL | Freq: Once | INTRAMUSCULAR | Status: AC | PRN
  Administered 2024-05-08: 100 mL via INTRAVENOUS

## 2024-05-08 MED ORDER — SODIUM CHLORIDE 0.9 % IV BOLUS
1000.0000 mL | Freq: Once | INTRAVENOUS | Status: AC
  Administered 2024-05-08: 1000 mL via INTRAVENOUS

## 2024-05-08 MED ORDER — ONDANSETRON HCL 4 MG/2ML IJ SOLN
4.0000 mg | Freq: Once | INTRAMUSCULAR | Status: AC
  Administered 2024-05-08: 4 mg via INTRAVENOUS
  Filled 2024-05-08: qty 2

## 2024-05-08 NOTE — Progress Notes (Signed)
 Hospitalist Transfer Note:    Nursing staff, Please call [facility] Admits & Consults System-Wide number on Amion [phone]) as soon as patient's arrival, so appropriate admitting provider can evaluate the pt.   Transferring facility: [facility] Requesting provider: Fonda Ruby, PA (EDP at [facility]) Reason for transfer: admission for further evaluation and management of acute cholecystitis.      60 M with history of paroxysmal atrial fibrillation status post ablation, chronically on Eliquis ,  who presented to [facility] ED complaining of generalized abdominal discomfort, most prominent in the right portion of the abdomen.  Denies fever.  Most recent dose of Eliquis  occurred on the morning of 05/08/2024.  Vital signs in the ED were notable for the following: Afebrile.   Labs were notable for CMP shows mildly elevated total bilirubin of 1.8.  Otherwise, liver enzymes are within normal limits.  CBC notable for white blood cell count 10,400.  Lactic acid 1.0.  Urinalysis inconsistent with UTI.  Imaging notable for CT abdomen/pelvis with contrast, which was reported to show findings suggestive of acute cholecystitis.  EDP d/w on-call general surgery , Dr. Ann, who requested admission to [facility], and conveyed that general surgery will formally consult and see patient in the morning. Dr. Ann conveyed that clear liquid diet until MN is okay, followed by NPO starting at MN.   Medications administered prior to transfer included the following: Zosyn .  Per RN UR , obs recommended.   Subsequently, I accepted this patient for transfer for observation to a med-tele bed at [facility] for further work-up and management of the above.        Eva Pore, DO Hospitalist

## 2024-05-08 NOTE — ED Provider Notes (Signed)
 [place] EMERGENCY DEPARTMENT AT [facility] Provider Note   CSN: [id number] Arrival date & time: 05/08/24  1447     Patient presents with: Abdominal Pain   Andrew [name] is a 68 y.o. male.   Patient with history of inguinal hernia repair, paroxysmal A-fib treated with ablation, on Eliquis , history of coronary artery stenting -- presents to the emergency department today for evaluation of abdominal pain.  Patient reports constipation over the past 2 weeks.  He has had an increasing sensation of pressure described as a balloon or a bag of nickels expanding in his abdomen.  Pain has increased and has been giving him difficulty sleeping.  Nausea, but no vomiting.  No blood in the stool but has had darker stool recently.  No urinary symptoms.  He has tried Dulcolax and MiraLAX with only small amounts of bowel movement.  Patient has had a cough recently as well and what feels like costochondritis, but in all of my ribs.  No shortness of breath.  He has felt a little dizzy, static sound in the ears at times.       Prior to Admission medications   Medication Sig Start Date End Date Taking? Authorizing Provider  amoxicillin (AMOXIL) 500 MG tablet Take 2,000 mg by mouth See admin instructions. Take 1 hour prior to dental procedures    [provider]  ascorbic acid (VITAMIN C) 1000 MG tablet Take 1,000 mg by mouth 2 (two) times daily. 09/11/15   [provider]  atorvastatin  (LIPITOR) 80 MG tablet Take 1 tablet (80 mg total) by mouth daily. 12/28/23   Croitoru, Mihai, MD  cetirizine (ZYRTEC) 10 MG tablet Take 10 mg by mouth daily as needed for allergies. 07/06/18   [provider]  Cholecalciferol (VITAMIN D3) 125 MCG (5000 UT) CAPS Take 5,000 Units by mouth 3 (three) times a week. Earlean Everts, and Sat 07/06/18   [provider]  Cyanocobalamin  (B-12) 2500 MCG TABS Take 2,500 mcg by mouth daily. 02/21/22   [provider]  doxycycline (ADOXA) 100  MG tablet Take 100 mg by mouth daily as needed (Rosacea). 02/05/23   [provider]  ELIQUIS  5 MG TABS tablet TAKE 1 TABLET BY MOUTH TWICE A DAY 12/25/23   Croitoru, Mihai, MD  ezetimibe  (ZETIA ) 10 MG tablet TAKE 1 TABLET BY MOUTH EVERY DAY 02/08/24   Croitoru, Mihai, MD  fluticasone (FLONASE) 50 MCG/ACT nasal spray Place 2 sprays into both nostrils daily as needed for allergies or rhinitis.    [provider]  L-Lysine 1000 MG TABS Take 500 mg by mouth 2 (two) times daily.    [provider]  loratadine-pseudoephedrine (CLARITIN-D 12 HOUR) 5-120 MG tablet Take 1 tablet by mouth daily as needed for allergies. 07/06/18   [provider]  Melatonin 10 MG CAPS Take 10 mg by mouth at bedtime.    [provider]  metoprolol  succinate (TOPROL -XL) 25 MG 24 hr tablet TAKE 1 TABLET (25 MG TOTAL) BY MOUTH DAILY. 02/08/24   Croitoru, Mihai, MD  Multiple Vitamins-Minerals (CENTRUM SILVER PO) Take 1 tablet by mouth daily.    [provider]  Multiple Vitamins-Minerals (PRESERVISION AREDS 2+MULTI VIT PO) Take 1 tablet by mouth 2 (two) times daily. 01/27/21   [provider]  nitroGLYCERIN  (NITROSTAT ) 0.4 MG SL tablet Place 1 tablet (0.4 mg total) under the tongue every 5 (five) minutes as needed for chest pain. 03/10/23 03/09/24  Verlin Lonni BIRCH, MD  Omega-3 Fatty Acids (FISH  OIL) 1200 MG CAPS Take 1,200 mg by mouth 2 (two) times daily.    [provider]  pantoprazole  (PROTONIX ) 40 MG tablet Take 1 tablet (40 mg total) by mouth every Monday, Wednesday, and Friday. 03/10/23 05/07/24  Verlin Lonni BIRCH, MD  traZODone  (DESYREL ) 50 MG tablet Take 50-100 mg by mouth at bedtime as needed for sleep. 01/09/23   [provider]  tretinoin (RETIN-A) 0.1 % cream Apply 1 Application topically at bedtime as needed (rosacea).    [provider]    Allergies: Watermelon [citrullus vulgaris], Banana, and Erythromycin    Review of  Systems  Updated Vital Signs BP (!) 140/92   Pulse 89   Temp 97.9 F (36.6 C)   Resp 20   Ht 6' 2 (1.88 m)   Wt 93.9 kg   SpO2 95%   BMI 26.58 kg/m   Physical Exam Vitals and nursing note reviewed.  Constitutional:      General: He is not in acute distress.    Appearance: He is well-developed.  HENT:     Head: Normocephalic and atraumatic.  Eyes:     General:        Right eye: No discharge.        Left eye: No discharge.     Conjunctiva/sclera: Conjunctivae normal.  Cardiovascular:     Rate and Rhythm: Normal rate and regular rhythm.     Heart sounds: Normal heart sounds.  Pulmonary:     Effort: Pulmonary effort is normal.     Breath sounds: Normal breath sounds.  Abdominal:     Palpations: Abdomen is soft.     Tenderness: There is abdominal tenderness. There is no guarding or rebound. Negative signs include Murphy's sign and McBurney's sign.     Hernia: No hernia is present.     Comments: Patient with generalized abdominal tenderness without rebound or guarding, seems to be more severe in the right lateral and lower abdomen.  Musculoskeletal:     Cervical back: Normal range of motion and neck supple.  Skin:    General: Skin is warm and dry.  Neurological:     Mental Status: He is alert.     (all labs ordered are listed, but only abnormal results are displayed) Labs Reviewed  COMPREHENSIVE METABOLIC PANEL WITH GFR - Abnormal; Notable for the following components:      Result Value   Sodium 131 (*)    Chloride 94 (*)    Glucose, Bld 115 (*)    Total Bilirubin 1.8 (*)    All other components within normal limits  URINALYSIS, ROUTINE W REFLEX MICROSCOPIC - Abnormal; Notable for the following components:   Ketones, ur 15 (*)    Protein, ur 30 (*)    All other components within normal limits  RESP PANEL BY RT-PCR (RSV, FLU A&B, COVID)  RVPGX2  LIPASE, BLOOD  CBC  LACTIC ACID, PLASMA  OCCULT BLOOD X 1 CARD TO LAB, STOOL    EKG: None  Radiology: CT  ABDOMEN PELVIS W CONTRAST Result Date: 05/08/2024 EXAM: CT ABDOMEN AND PELVIS WITH CONTRAST 05/08/2024 06:13:34 PM TECHNIQUE: CT of the abdomen and pelvis was performed with the administration of 100 mL of iohexol  (OMNIPAQUE ) 300 MG/ML solution. Multiplanar reformatted images are provided for review. Automated exposure control, iterative reconstruction, and/or weight-based adjustment of the mA/kV was utilized to reduce the radiation dose to as low as reasonably achievable. COMPARISON: None available. CLINICAL HISTORY: Abdominal pain, acute, nonlocalized; progressive generalized abd pain, constipation,  h/o hernia repair no other abd surgery. FINDINGS: LOWER CHEST: No acute abnormality. LIVER: The liver is unremarkable. GALLBLADDER AND BILE DUCTS: Gallbladder wall thickening with surrounding inflammatory changes is noted concerning for cholecystitis. Possible cholelithiasis. No biliary ductal dilatation. SPLEEN: No acute abnormality. PANCREAS: No acute abnormality. ADRENAL GLANDS: No acute abnormality. KIDNEYS, URETERS AND BLADDER: Bilateral renal cysts. Per consensus, no follow-up is needed for simple Bosniak type 1 and 2 renal cysts, unless the patient has a malignancy history or risk factors. No stones in the kidneys or ureters. No hydronephrosis. No perinephric or periureteral stranding. Urinary bladder is unremarkable. GI AND BOWEL: Small hiatal hernia. Stomach demonstrates no acute abnormality. There is no bowel obstruction. PERITONEUM AND RETROPERITONEUM: No ascites. No free air. VASCULATURE: Aortic atherosclerosis. Aorta is normal in caliber. LYMPH NODES: No lymphadenopathy. REPRODUCTIVE ORGANS: No acute abnormality. BONES AND SOFT TISSUES: No acute osseous abnormality. No focal soft tissue abnormality. IMPRESSION: 1. Gallbladder wall thickening with surrounding inflammatory changes, concerning for cholecystitis. Possible cholelithiasis. 2. Small hiatal hernia. 3. Aortic atherosclerosis. Electronically  signed by: Lynwood Seip MD 05/08/2024 06:25 PM EST RP Workstation: [id number]   DG Chest 2 View Result Date: 05/08/2024 CLINICAL DATA:  Cough, upper abdominal pain EXAM: CHEST - 2 VIEW COMPARISON:  02/09/2023 FINDINGS: Frontal and lateral views of the chest demonstrate a stable cardiac silhouette stable ectasia of the thoracic aorta. No airspace disease, effusion, or pneumothorax. No acute bony abnormalities. IMPRESSION: 1. No acute intrathoracic process. Electronically Signed   By: Ozell Daring M.D.   On: 05/08/2024 18:15     Procedures   Medications Ordered in the ED - No data to display  ED Course  Patient seen and examined. History obtained directly from patient. Work-up including labs, imaging, EKG ordered in triage, if performed, were reviewed.    Labs/EKG: Independently reviewed and interpreted.  This included: CBC with high normal white blood cell count, hemoglobin normal; CMP with slightly low sodium and chloride, minimally elevated bilirubin at 1.8, normal liver function test; lipase normal; viral panel negative; urine without signs of infection.  Imaging: Ordered chest x-ray and ordered CT abdomen pelvis.  Medications/Fluids: None ordered  Most recent vital signs reviewed and are as follows: BP (!) 140/92   Pulse 89   Temp 97.9 F (36.6 C)   Resp 20   Ht 6' 2 (1.88 m)   Wt 93.9 kg   SpO2 95%   BMI 26.58 kg/m   Initial impression: Abdominal pain with constipation, unclear cause, symptoms have been progressive over the past 2 weeks.  Imaging ordered and pending.  8:57 PM Reassessment performed. Patient appears stable.  Pain remains controlled.  Imaging personally visualized and interpreted including: CT scan suggestive of acute cholecystitis.  Reviewed pertinent lab work and imaging with patient at bedside. Questions answered.   Most current vital signs reviewed and are as follows: BP (!) 138/92   Pulse 82   Temp 98.3 F (36.8 C)   Resp 18   Ht 6' 2 (1.88  m)   Wt 93.9 kg   SpO2 96%   BMI 26.58 kg/m   Plan: Admit to hospital.   I have spoken with Dr. Ann of general surgery, advises antibiotics, Zosyn  has already been ordered.  Recommends admission to [facility] if possible due to preferable schedule.  Requests medical admission.  I spoke with Dr. Marcene with [facility] who accepts for admission.  Surgery recommends clear liquids tonight, n.p.o. after midnight.  They will decide on timing of surgery based  on anticoagulation in the morning.                                   Medical Decision Making Amount and/or Complexity of Data Reviewed Labs: ordered. Radiology: ordered.  Risk Prescription drug management. Decision regarding hospitalization.   For this patient's complaint of abdominal pain, the following conditions were considered on the differential diagnosis: gastritis/PUD, enteritis/duodenitis, appendicitis, cholelithiasis/cholecystitis, cholangitis, pancreatitis, ruptured viscus, colitis, diverticulitis, small/large bowel obstruction, proctitis, cystitis, pyelonephritis, ureteral colic, aortic dissection, aortic aneurysm. Atypical chest etiologies were also considered including ACS, PE, and pneumonia.  Imaging concerning for acute cholecystitis --patient will be admitted.        Final diagnoses:  Acute cholecystitis    ED Discharge Orders     None          Desiderio Chew, PA-C 05/08/24 2100    Emil Share, DO 05/08/24 2141

## 2024-05-08 NOTE — H&P (Addendum)
 History and Physical  Andrew [name] FMW:[id number] DOB: 15-Sep-1955 DOA: 05/08/2024  Referring physician: Accepted by Andrew [name], [name], hospitalist service. PCP: Andrew [name]  Outpatient Specialists: Cardiology, sleep medicine. Patient coming from: Home.  Chief Complaint: Right upper quadrant abdominal pain.  HPI: Andrew [name] is a 68 y.o. male with medical history significant for paroxysmal A-fib on Eliquis , OSA on CPAP, coronary artery disease status post PCI with stent placement in 2024 on Eliquis  and Lipitor, hyperlipidemia, who presents to the ER due to right upper and right lower quadrant abdominal pain.  Associated with constipation.  Pain started initially about 2 weeks ago and worsened today.  Endorses nausea without vomiting.  He thought he had appendicitis.  He presented to [facility] ED for further evaluation.  In the ER, CT abdomen and pelvis revealed gallbladder wall thickening with surrounding inflammatory changes concerning for cholecystitis.  Possible cholelithiasis.  Small hiatal hernia, aortic atherosclerosis.  EDP discussed the case with general surgery Andrew [name] who recommended IV antibiotics, Zosyn , and admission to Andrew [name] [name] by medicine team.  N.p.o. after midnight for possible surgery on 05/09/2024.  The patient last took home Eliquis  on 05/08/24 morning.  Admitted by Andrew [name], hospitalist service.  Accepted by Andrew [name] and transferred to Andrew [name] telemetry unit as observation status.  ED Course: Temperature 98.5.  BP 110/79, pulse 82, respiration 18, O2 saturation 94% on room air.  Review of Systems: Review of systems as noted in the HPI. All other systems reviewed and are negative.   Past Medical History:  Diagnosis Date   Cancer Andrew [name])    skin cancer on nose 2017   Difficult intubation    Needs to use pediatric intubation due to TMJ surgery   GERD (gastroesophageal reflux disease)    History of cardiac ablation    History of  hiatal hernia    Nocturnal leg cramps 04/04/2017   Peripheral neuropathy 12/05/2016   Sleep apnea    Past Surgical History:  Procedure Laterality Date   ATRIAL FIBRILLATION ABLATION N/A 08/18/2023   Procedure: ATRIAL FIBRILLATION ABLATION;  Surgeon: Nancey Eulas BRAVO, [name];  Location: [facility] INVASIVE CV LAB;  Service: Cardiovascular;  Laterality: N/A;   COLONOSCOPY WITH PROPOFOL  N/A 05/16/2016   Procedure: COLONOSCOPY WITH PROPOFOL ;  Surgeon: Gladis MARLA Louder, [name];  Location: [facility] ENDOSCOPY;  Service: Endoscopy;  Laterality: N/A;   CORONARY LITHOTRIPSY N/A 03/10/2023   Procedure: CORONARY LITHOTRIPSY;  Surgeon: Verlin Lonni BIRCH, [name];  Location: [facility] INVASIVE CV LAB;  Service: Cardiovascular;  Laterality: N/A;   CORONARY STENT INTERVENTION N/A 03/10/2023   Procedure: CORONARY STENT INTERVENTION;  Surgeon: Verlin Lonni BIRCH, [name];  Location: [facility] INVASIVE CV LAB;  Service: Cardiovascular;  Laterality: N/A;   HIATAL HERNIA REPAIR     14 years ago   LEFT HEART CATH AND CORONARY ANGIOGRAPHY N/A 03/10/2023   Procedure: LEFT HEART CATH AND CORONARY ANGIOGRAPHY;  Surgeon: Verlin Lonni BIRCH, [name];  Location: [facility] INVASIVE CV LAB;  Service: Cardiovascular;  Laterality: N/A;   LUMBAR DISC SURGERY  2014   L4   total TMJ replacement Bilateral    15-18 years ago    Social History:  reports that he has never smoked. He has never used smokeless tobacco. He reports current alcohol use. He reports that he does not use drugs.   Allergies  Allergen Reactions   Watermelon [Citrullus Vulgaris] Anaphylaxis   Banana     Inside of mouth itch   Erythromycin Itching    Family History  Problem Relation Age of Onset   Andrew [name]       Prior to Admission medications   Medication Sig Start Date End Date Taking? Authorizing Provider  amoxicillin (AMOXIL) 500 MG tablet Take 2,000 mg by mouth See admin instructions. Take 1 hour prior to dental procedures    Provider, Historical, [name]  ascorbic acid  (VITAMIN C) 1000 MG tablet Take 1,000 mg by mouth 2 (two) times daily. 09/11/15   Provider, Historical, [name]  atorvastatin  (LIPITOR) 80 MG tablet Take 1 tablet (80 mg total) by mouth daily. 12/28/23   Croitoru, Mihai, [name]  cetirizine (ZYRTEC) 10 MG tablet Take 10 mg by mouth daily as needed for allergies. 07/06/18   Provider, Historical, [name]  Cholecalciferol (VITAMIN D3) 125 MCG (5000 UT) CAPS Take 5,000 Units by mouth 3 (three) times a week. Earlean Everts, and Sat 07/06/18   Provider, Historical, [name]  Cyanocobalamin  (B-12) 2500 MCG TABS Take 2,500 mcg by mouth daily. 02/21/22   Provider, Historical, [name]  doxycycline (ADOXA) 100 MG tablet Take 100 mg by mouth daily as needed (Rosacea). 02/05/23   Provider, Historical, [name]  ELIQUIS  5 MG TABS tablet TAKE 1 TABLET BY MOUTH TWICE A DAY 12/25/23   Croitoru, Mihai, [name]  ezetimibe  (ZETIA ) 10 MG tablet TAKE 1 TABLET BY MOUTH EVERY DAY 02/08/24   Croitoru, Mihai, [name]  fluticasone (FLONASE) 50 MCG/ACT nasal spray Place 2 sprays into both nostrils daily as needed for allergies or rhinitis.    Provider, Historical, [name]  L-Lysine 1000 MG TABS Take 500 mg by mouth 2 (two) times daily.    Provider, Historical, [name]  loratadine-pseudoephedrine (CLARITIN-D 12 HOUR) 5-120 MG tablet Take 1 tablet by mouth daily as needed for allergies. 07/06/18   Provider, Historical, [name]  Melatonin 10 MG CAPS Take 10 mg by mouth at bedtime.    Provider, Historical, [name]  metoprolol  succinate (TOPROL -XL) 25 MG 24 hr tablet TAKE 1 TABLET (25 MG TOTAL) BY MOUTH DAILY. 02/08/24   Croitoru, Mihai, [name]  Multiple Vitamins-Minerals (CENTRUM SILVER PO) Take 1 tablet by mouth daily.    Provider, Historical, [name]  Multiple Vitamins-Minerals (PRESERVISION AREDS 2+MULTI VIT PO) Take 1 tablet by mouth 2 (two) times daily. 01/27/21   Provider, Historical, [name]  nitroGLYCERIN  (NITROSTAT ) 0.4 MG SL tablet Place 1 tablet (0.4 mg total) under the tongue every 5 (five) minutes as needed for chest pain. 03/10/23 03/09/24  Verlin Lonni BIRCH, [name]  Omega-3 Fatty Acids (FISH OIL) 1200 MG CAPS Take 1,200 mg by mouth 2 (two) times daily.    Provider, Historical, [name]  pantoprazole  (PROTONIX ) 40 MG tablet Take 1 tablet (40 mg total) by mouth every Monday, Wednesday, and Friday. 03/10/23 05/07/24  Verlin Lonni BIRCH, [name]  traZODone  (DESYREL ) 50 MG tablet Take 50-100 mg by mouth at bedtime as needed for sleep. 01/09/23   Provider, Historical, [name]  tretinoin (RETIN-A) 0.1 % cream Apply 1 Application topically at bedtime as needed (rosacea).    Provider, Historical, [name]    Physical Exam: BP (!) 139/94 (BP Location: Right Arm)   Pulse 86   Temp 98.4 F (36.9 C) (Oral)   Resp 18   Ht 6' 2 (1.88 m)   Wt 93.9 kg   SpO2 96%   BMI 26.58 kg/m   General: 68 y.o. year-old male well developed well nourished in no acute distress.  Alert and oriented x3. Cardiovascular: Regular rate and rhythm with no rubs or gallops.  No thyromegaly or JVD noted.  No lower extremity  edema. 2/4 pulses in all 4 extremities. Respiratory: Clear to auscultation with no wheezes or rales. Good inspiratory effort. Abdomen: Soft right upper quadrant tenderness, nondistended with normal bowel sounds x4 quadrants. Muskuloskeletal: No cyanosis, clubbing or edema noted bilaterally Neuro: CN II-XII intact, strength, sensation, reflexes Skin: No ulcerative lesions noted or rashes Psychiatry: Judgement and insight appear normal. Mood is appropriate for condition and setting          Labs on Admission:  Basic Metabolic Panel: Recent Labs  Lab 05/08/24 1519  NA 131*  K 4.3  CL 94*  CO2 23  GLUCOSE 115*  BUN 13  CREATININE 1.14  CALCIUM  9.1   Liver Function Tests: Recent Labs  Lab 05/08/24 1519  AST 40  ALT 27  ALKPHOS 122  BILITOT 1.8*  PROT 7.3  ALBUMIN 4.1   Recent Labs  Lab 05/08/24 1519  LIPASE 19   No results for input(s): AMMONIA in the last 168 hours. CBC: Recent Labs  Lab 05/08/24 1519  WBC 10.4  HGB 14.9  HCT 42.3   MCV 87.2  PLT 175   Cardiac Enzymes: No results for input(s): CKTOTAL, CKMB, CKMBINDEX, TROPONINI in the last 168 hours.  BNP (last 3 results) No results for input(s): BNP in the last 8760 hours.  ProBNP (last 3 results) No results for input(s): PROBNP in the last 8760 hours.  CBG: No results for input(s): GLUCAP in the last 168 hours.  Radiological Exams on Admission: CT ABDOMEN PELVIS W CONTRAST Result Date: 05/08/2024 EXAM: CT ABDOMEN AND PELVIS WITH CONTRAST 05/08/2024 06:13:34 PM TECHNIQUE: CT of the abdomen and pelvis was performed with the administration of 100 mL of iohexol  (OMNIPAQUE ) 300 MG/ML solution. Multiplanar reformatted images are provided for review. Automated exposure control, iterative reconstruction, and/or weight-based adjustment of the mA/kV was utilized to reduce the radiation dose to as low as reasonably achievable. COMPARISON: None available. CLINICAL HISTORY: Abdominal pain, acute, nonlocalized; progressive generalized abd pain, constipation, h/o hernia repair no other abd surgery. FINDINGS: LOWER CHEST: No acute abnormality. LIVER: The liver is unremarkable. GALLBLADDER AND BILE DUCTS: Gallbladder wall thickening with surrounding inflammatory changes is noted concerning for cholecystitis. Possible cholelithiasis. No biliary ductal dilatation. SPLEEN: No acute abnormality. PANCREAS: No acute abnormality. ADRENAL GLANDS: No acute abnormality. KIDNEYS, URETERS AND BLADDER: Bilateral renal cysts. Per consensus, no follow-up is needed for simple Bosniak type 1 and 2 renal cysts, unless the patient has a malignancy history or risk factors. No stones in the kidneys or ureters. No hydronephrosis. No perinephric or periureteral stranding. Urinary bladder is unremarkable. GI AND BOWEL: Small hiatal hernia. Stomach demonstrates no acute abnormality. There is no bowel obstruction. PERITONEUM AND RETROPERITONEUM: No ascites. No free air. VASCULATURE: Aortic  atherosclerosis. Aorta is normal in caliber. LYMPH NODES: No lymphadenopathy. REPRODUCTIVE ORGANS: No acute abnormality. BONES AND SOFT TISSUES: No acute osseous abnormality. No focal soft tissue abnormality. IMPRESSION: 1. Gallbladder wall thickening with surrounding inflammatory changes, concerning for cholecystitis. Possible cholelithiasis. 2. Small hiatal hernia. 3. Aortic atherosclerosis. Electronically signed by: Lynwood Seip [name] 05/08/2024 06:25 PM EST RP Workstation: [id number]   DG Chest 2 View Result Date: 05/08/2024 CLINICAL DATA:  Cough, upper abdominal pain EXAM: CHEST - 2 VIEW COMPARISON:  02/09/2023 FINDINGS: Frontal and lateral views of the chest demonstrate a stable cardiac silhouette stable ectasia of the thoracic aorta. No airspace disease, effusion, or pneumothorax. No acute bony abnormalities. IMPRESSION: 1. No acute intrathoracic process. Electronically Signed   By: Ozell Daring M.D.   On: 05/08/2024  18:15    EKG: I independently viewed the EKG done and my findings are as followed: None available at the time of this visit.  Assessment/Plan Present on Admission:  Acute cholecystitis  Principal Problem:   Acute cholecystitis  Acute cholecystitis, possible gallstone seen on CT scan Follow right upper quadrant abdominal ultrasound. Continue IV Zosyn  Continue pain control as needed IV fluid hydration IV antiemetics as needed  Coronary artery disease status post PCI with stenting Prior to admission on Eliquis  and Lipitor No reported anginal symptoms. Closely monitor on telemetry. Continue to hold of home Eliquis , last dose on 05/08/2024 morning.  Hyperlipidemia Resume home regimen.  Situational insomnia Prior to admission, on trazodone  100 mg nightly, resume  Constipation Bowel regimen as needed  OSA CPAP nightly   Time: 75 minutes.   DVT prophylaxis: SCDs.  Code Status: Full code.  Family Communication: None at bedside.  Disposition Plan: Admitted  to telemetry unit.  Consults called: General Surgery, Dr. Teresa.  Admission status: Observation status.   Status is: Observation    Terry LOISE Hurst [name] Pager [phone]  If 7PM-7AM, please contact night-coverage [email or web address] Password [facility]  05/08/2024, 10:27 PM

## 2024-05-08 NOTE — ED Triage Notes (Signed)
 Pt pvia pov from home with abdominal pain and constipation x 2 weeks. Pt states he had a small BM this morning that was very dark, possibly bloody. Pt states he has bloating and right sided abdominal pain. Endorses nausea, denies emesis. Pt also reports some dizziness x 1 week, static sound in his ears, RLQ pain. Pt a&o x 4; nad noted.    Pt arrives with stool sample.

## 2024-05-09 ENCOUNTER — Encounter (HOSPITAL_COMMUNITY): Payer: Self-pay | Admitting: Internal Medicine

## 2024-05-09 ENCOUNTER — Observation Stay (HOSPITAL_COMMUNITY)

## 2024-05-09 DIAGNOSIS — K810 Acute cholecystitis: Secondary | ICD-10-CM | POA: Diagnosis not present

## 2024-05-09 DIAGNOSIS — K7689 Other specified diseases of liver: Secondary | ICD-10-CM | POA: Diagnosis not present

## 2024-05-09 DIAGNOSIS — K838 Other specified diseases of biliary tract: Secondary | ICD-10-CM | POA: Diagnosis not present

## 2024-05-09 DIAGNOSIS — R932 Abnormal findings on diagnostic imaging of liver and biliary tract: Secondary | ICD-10-CM | POA: Diagnosis not present

## 2024-05-09 LAB — CBC WITH DIFFERENTIAL/PLATELET
Abs Immature Granulocytes: 0.02 K/uL (ref 0.00–0.07)
Basophils Absolute: 0 K/uL (ref 0.0–0.1)
Basophils Relative: 0 %
Eosinophils Absolute: 0.2 K/uL (ref 0.0–0.5)
Eosinophils Relative: 3 %
HCT: 38.4 % — ABNORMAL LOW (ref 39.0–52.0)
Hemoglobin: 12.8 g/dL — ABNORMAL LOW (ref 13.0–17.0)
Immature Granulocytes: 0 %
Lymphocytes Relative: 6 %
Lymphs Abs: 0.5 K/uL — ABNORMAL LOW (ref 0.7–4.0)
MCH: 30.2 pg (ref 26.0–34.0)
MCHC: 33.3 g/dL (ref 30.0–36.0)
MCV: 90.6 fL (ref 80.0–100.0)
Monocytes Absolute: 0.9 K/uL (ref 0.1–1.0)
Monocytes Relative: 13 %
Neutro Abs: 5.5 K/uL (ref 1.7–7.7)
Neutrophils Relative %: 78 %
Platelets: 158 K/uL (ref 150–400)
RBC: 4.24 MIL/uL (ref 4.22–5.81)
RDW: 13.6 % (ref 11.5–15.5)
WBC: 7.1 K/uL (ref 4.0–10.5)
nRBC: 0 % (ref 0.0–0.2)

## 2024-05-09 LAB — COMPREHENSIVE METABOLIC PANEL WITH GFR
ALT: 32 U/L (ref 0–44)
AST: 43 U/L — ABNORMAL HIGH (ref 15–41)
Albumin: 3.6 g/dL (ref 3.5–5.0)
Alkaline Phosphatase: 145 U/L — ABNORMAL HIGH (ref 38–126)
Anion gap: 10 (ref 5–15)
BUN: 14 mg/dL (ref 8–23)
CO2: 25 mmol/L (ref 22–32)
Calcium: 8.3 mg/dL — ABNORMAL LOW (ref 8.9–10.3)
Chloride: 100 mmol/L (ref 98–111)
Creatinine, Ser: 1.2 mg/dL (ref 0.61–1.24)
GFR, Estimated: >60 mL/min (ref >60)
Glucose, Bld: 122 mg/dL — ABNORMAL HIGH (ref 70–99)
Potassium: 4.1 mmol/L (ref 3.5–5.1)
Sodium: 134 mmol/L — ABNORMAL LOW (ref 135–145)
Total Bilirubin: 1.1 mg/dL (ref 0.0–1.2)
Total Protein: 6.3 g/dL — ABNORMAL LOW (ref 6.5–8.1)

## 2024-05-09 LAB — MAGNESIUM: Magnesium: 2.7 mg/dL — ABNORMAL HIGH (ref 1.7–2.4)

## 2024-05-09 LAB — HIV ANTIBODY (ROUTINE TESTING W REFLEX): HIV Screen 4th Generation wRfx: NONREACTIVE

## 2024-05-09 LAB — PHOSPHORUS: Phosphorus: 2.2 mg/dL — ABNORMAL LOW (ref 2.5–4.6)

## 2024-05-09 MED ORDER — OMEGA-3-ACID ETHYL ESTERS 1 G PO CAPS
2.0000 g | ORAL_CAPSULE | Freq: Two times a day (BID) | ORAL | Status: DC
  Administered 2024-05-09 – 2024-05-12 (×6): 2 g via ORAL
  Filled 2024-05-09 (×6): qty 2

## 2024-05-09 MED ORDER — TRAZODONE HCL 100 MG PO TABS
100.0000 mg | ORAL_TABLET | Freq: Every evening | ORAL | Status: AC | PRN
  Administered 2024-05-09 – 2024-05-10 (×2): 100 mg via ORAL
  Filled 2024-05-09 (×2): qty 1

## 2024-05-09 MED ORDER — ATORVASTATIN CALCIUM 20 MG PO TABS
80.0000 mg | ORAL_TABLET | Freq: Every day | ORAL | Status: DC
  Administered 2024-05-09 – 2024-05-12 (×3): 80 mg via ORAL
  Filled 2024-05-09 (×3): qty 4

## 2024-05-09 MED ORDER — EZETIMIBE 10 MG PO TABS
10.0000 mg | ORAL_TABLET | Freq: Every day | ORAL | Status: DC
  Administered 2024-05-09 – 2024-05-12 (×3): 10 mg via ORAL
  Filled 2024-05-09 (×3): qty 1

## 2024-05-09 MED ORDER — SODIUM CHLORIDE 0.9 % IV SOLN
2.0000 g | INTRAVENOUS | Status: DC
  Administered 2024-05-09 – 2024-05-10 (×2): 2 g via INTRAVENOUS
  Filled 2024-05-09 (×2): qty 20

## 2024-05-09 MED ORDER — ACETAMINOPHEN 500 MG PO TABS: 500.0000 mg | ORAL_TABLET | Freq: Four times a day (QID) | ORAL | Status: DC | PRN

## 2024-05-09 MED ORDER — PIPERACILLIN-TAZOBACTAM 3.375 G IVPB
3.3750 g | Freq: Three times a day (TID) | INTRAVENOUS | Status: DC
  Administered 2024-05-09: 3.375 g via INTRAVENOUS
  Filled 2024-05-09: qty 50

## 2024-05-09 MED ORDER — INDOCYANINE GREEN 25 MG IJ SOLR
1.2500 mg | Freq: Once | INTRAMUSCULAR | Status: AC
  Administered 2024-05-10: 1.25 mg via INTRAVENOUS
  Filled 2024-05-09: qty 10

## 2024-05-09 MED ORDER — POLYETHYLENE GLYCOL 3350 17 G PO PACK
17.0000 g | PACK | Freq: Every day | ORAL | Status: DC | PRN
  Administered 2024-05-10 – 2024-05-11 (×2): 17 g via ORAL
  Filled 2024-05-09 (×2): qty 1

## 2024-05-09 MED ORDER — SENNOSIDES-DOCUSATE SODIUM 8.6-50 MG PO TABS
1.0000 | ORAL_TABLET | Freq: Two times a day (BID) | ORAL | Status: AC
  Administered 2024-05-09 – 2024-05-10 (×3): 1 via ORAL
  Filled 2024-05-09 (×3): qty 1

## 2024-05-09 MED ORDER — SODIUM CHLORIDE 0.9 % IV SOLN: INTRAVENOUS | Status: DC

## 2024-05-09 NOTE — Consult Note (Signed)
 Andrew [name] [birth year]  [id number].    Requesting MD: Dr. Yetta Blanch Chief Complaint/Reason for Consult: cholecystitis  HPI:  This is a 68 yo white male with a history of CAD s/p PCI in 2024 currently on Eliquis  (LD 12/10 am) who has had a change in his BMs over the last month to more constipation and bloating.  Over the last week he has been having some pain across his upper abdomen.  He has had one episode of vomiting.  He states after eating, he feels more bloated and a heaviness in his stomach.  Due to its persistence he presented for evaluation.  He has been found to have acute cholecystitis on his imaging.  His WBC is normal. His LFTs are normal except TB was slightly up at 1.8 yesterday, but now 1.1 today.  His AST is now 43 and alk phos 145 from 122.  His Eliquis  has been held and we are asked to see for surgical evaluation.  He still works and plays sports etc with no chest pain or any other complaints.    ROS: ROS: see HPI  Family History  Problem Relation Age of Onset   Anuerysm Mother     Past Medical History:  Diagnosis Date   Cancer [facility])    skin cancer on nose 2017   Difficult intubation    Needs to use pediatric intubation due to TMJ surgery   GERD (gastroesophageal reflux disease)    History of cardiac ablation    History of hiatal hernia    Nocturnal leg cramps 04/04/2017   Peripheral neuropathy 12/05/2016   Sleep apnea     Past Surgical History:  Procedure Laterality Date   ATRIAL FIBRILLATION ABLATION N/A 08/18/2023   Procedure: ATRIAL FIBRILLATION ABLATION;  Surgeon: Nancey Eulas BRAVO, MD;  Location: [facility] INV[facility];  Service: Cardiovascular;  Laterality: N/A;   COLONOSCOPY WITH PROPOFOL  N/A 05/16/2016   Procedure: COLONOSCOPY WITH PROPOFOL ;  Surgeon: Gladis MARLA Louder, MD;  Location: [facility] ENDOSCOPY;  Service: Endoscopy;  Laterality: N/A;   CORONARY LITHOTRIPSY N/A 03/10/2023   Procedure: CORONARY LITHOTRIPSY;  Surgeon: Verlin Lonni BIRCH, MD;   Location: [facility] INVASIVE CV LAB;  Service: Cardiovascular;  Laterality: N/A;   CORONARY STENT INTERVENTION N/A 03/10/2023   Procedure: CORONARY STENT INTERVENTION;  Surgeon: Verlin Lonni BIRCH, MD;  Location: [facility] CV LAB;  Service: Cardiovascular;  Laterality: N/A;   HIATAL HERNIA REPAIR     14 years ago   LEFT HEART CATH AND CORONARY ANGIOGRAPHY N/A 03/10/2023   Procedure: LEFT HEART CATH AND CORONARY ANGIOGRAPHY;  Surgeon: Verlin Lonni BIRCH, MD;  Location: [facility];  Service: Cardiovascular;  Laterality: N/A;   LUMBAR DISC SURGERY  2014   L4   total TMJ replacement Bilateral    15-18 years ago    Social History:  reports that he has never smoked. He has never used smokeless tobacco. He reports current alcohol use. He reports that he does not use drugs.  Allergies: Allergies[1]  Medications Prior to Admission  Medication Sig Dispense Refill   amoxicillin (AMOXIL) 500 MG tablet Take 2,000 mg by mouth See admin instructions. Take 1 hour prior to dental procedures     ascorbic acid (VITAMIN C) 1000 MG tablet Take 1,000 mg by mouth 2 (two) times daily.     atorvastatin  (LIPITOR) 80 MG tablet Take 1 tablet (80 mg total) by mouth daily. 90 tablet 3   cetirizine (ZYRTEC) 10 MG tablet Take 10  mg by mouth daily as needed for allergies.     Cholecalciferol (VITAMIN D3) 125 MCG (5000 UT) CAPS Take 5,000 Units by mouth 3 (three) times a week. Tues, Thurs, and Sat     Cyanocobalamin  (B-12) 2500 MCG TABS Take 2,500 mcg by mouth daily.     doxycycline (ADOXA) 100 MG tablet Take 100 mg by mouth daily as needed (Rosacea).     ELIQUIS  5 MG TABS tablet TAKE 1 TABLET BY MOUTH TWICE A DAY 180 tablet 3   ezetimibe  (ZETIA ) 10 MG tablet TAKE 1 TABLET BY MOUTH EVERY DAY 90 tablet 3   fluticasone (FLONASE) 50 MCG/ACT nasal spray Place 2 sprays into both nostrils daily as needed for allergies or rhinitis.     L-Lysine 1000 MG TABS Take 500 mg by mouth 2 (two) times daily.      loratadine-pseudoephedrine (CLARITIN-D 12 HOUR) 5-120 MG tablet Take 1 tablet by mouth daily as needed for allergies.     Melatonin 10 MG CAPS Take 10 mg by mouth at bedtime.     metoprolol  succinate (TOPROL -XL) 25 MG 24 hr tablet TAKE 1 TABLET (25 MG TOTAL) BY MOUTH DAILY. 90 tablet 3   Multiple Vitamins-Minerals (CENTRUM SILVER PO) Take 1 tablet by mouth daily.     Multiple Vitamins-Minerals (PRESERVISION AREDS 2+MULTI VIT PO) Take 1 tablet by mouth 2 (two) times daily.     nitroGLYCERIN  (NITROSTAT ) 0.4 MG SL tablet Place 1 tablet (0.4 mg total) under the tongue every 5 (five) minutes as needed for chest pain. 25 tablet 3   Omega-3 Fatty Acids (FISH OIL) 1200 MG CAPS Take 1,200 mg by mouth 2 (two) times daily.     pantoprazole  (PROTONIX ) 40 MG tablet Take 1 tablet (40 mg total) by mouth every Monday, Wednesday, and Friday. 30 tablet 4   traZODone  (DESYREL ) 50 MG tablet Take 50-100 mg by mouth at bedtime as needed for sleep.     tretinoin (RETIN-A) 0.1 % cream Apply 1 Application topically at bedtime as needed (rosacea).       Physical Exam: Blood pressure (!) 133/99, pulse 63, temperature 97.9 F (36.6 C), temperature source Oral, resp. rate 18, height 6' 2 (1.88 m), weight 93.9 kg, SpO2 92%. General: pleasant, WD, WN white male who is laying in bed in NAD HEENT: head is normocephalic, atraumatic.  Sclera are noninjected.  PERRL.  Ears and nose without any masses or lesions.  Mouth is pink and moist Heart: regular, rate, and rhythm.  Normal s1,s2. No obvious murmurs, gallops, or rubs noted.  Lungs: CTAB, no wheezes, rhonchi, or rales noted.  Respiratory effort nonlabored Abd: soft, mildly tender across his upper abdomen with greatest on right side, ND, +BS, no masses or organomegaly, small fat containing umbilical hernia Psych: A&Ox3 with an appropriate affect.   Results for orders placed or performed during the hospital encounter of 05/08/24 (from the past 48 hours)  Resp panel by RT-PCR  (RSV, Flu A&B, Covid) Anterior Nasal Swab     Status: None   Collection Time: 05/08/24  3:19 PM   Specimen: Anterior Nasal Swab  Result Value Ref Range   SARS Coronavirus 2 by RT PCR NEGATIVE NEGATIVE    Comment: (NOTE) SARS-CoV-2 target nucleic acids are NOT DETECTED.  The SARS-CoV-2 RNA is generally detectable in upper respiratory specimens during the acute phase of infection. The lowest concentration of SARS-CoV-2 viral copies this assay can detect is 138 copies/mL. A negative result does not preclude SARS-Cov-2 infection and should not  be used as the sole basis for treatment or other patient management decisions. A negative result may occur with  improper specimen collection/handling, submission of specimen other than nasopharyngeal swab, presence of viral mutation(s) within the areas targeted by this assay, and inadequate number of viral copies(<138 copies/mL). A negative result must be combined with clinical observations, patient history, and epidemiological information. The expected result is Negative.  Fact Sheet for Patients:  bloggercourse.com  Fact Sheet for Healthcare Providers:  seriousbroker.it  This test is no t yet approved or cleared by the [place]  FDA and  has been authorized for detection and/or diagnosis of SARS-CoV-2 by FDA under an Emergency Use Authorization (EUA). This EUA will remain  in effect (meaning this test can be used) for the duration of the COVID-19 declaration under Section 564(b)(1) of the Act, 21 U.S.C.section 360bbb-3(b)(1), unless the authorization is terminated  or revoked sooner.       Influenza A by PCR NEGATIVE NEGATIVE   Influenza B by PCR NEGATIVE NEGATIVE    Comment: (NOTE) The Xpert Xpress SARS-CoV-2/FLU/RSV plus assay is intended as an aid in the diagnosis of influenza from Nasopharyngeal swab specimens and should not be used as a sole basis for treatment. Nasal washings  and aspirates are unacceptable for Xpert Xpress SARS-CoV-2/FLU/RSV testing.  Fact Sheet for Patients: bloggercourse.com  Fact Sheet for Healthcare Providers: seriousbroker.it  This test is not yet approved or cleared by the [place]  FDA and has been authorized for detection and/or diagnosis of SARS-CoV-2 by FDA under an Emergency Use Authorization (EUA). This EUA will remain in effect (meaning this test can be used) for the duration of the COVID-19 declaration under Section 564(b)(1) of the Act, 21 U.S.C. section 360bbb-3(b)(1), unless the authorization is terminated or revoked.     Resp Syncytial Virus by PCR NEGATIVE NEGATIVE    Comment: (NOTE) Fact Sheet for Patients: bloggercourse.com  Fact Sheet for Healthcare Providers: seriousbroker.it  This test is not yet approved or cleared by the [place]  FDA and has been authorized for detection and/or diagnosis of SARS-CoV-2 by FDA under an Emergency Use Authorization (EUA). This EUA will remain in effect (meaning this test can be used) for the duration of the COVID-19 declaration under Section 564(b)(1) of the Act, 21 U.S.C. section 360bbb-3(b)(1), unless the authorization is terminated or revoked.  Performed at [facility], [address], [place], [place] [address]   Lipase, blood     Status: None   Collection Time: 05/08/24  3:19 PM  Result Value Ref Range   Lipase 19 11 - 51 U/L    Comment: Performed at [facility], [address] [place], [place] [address]  Comprehensive metabolic panel     Status: Abnormal   Collection Time: 05/08/24  3:19 PM  Result Value Ref Range   Sodium 131 (L) 135 - 145 mmol/L   Potassium 4.3 3.5 - 5.1 mmol/L   Chloride 94 (L) 98 - 111 mmol/L   CO2 23 22 - 32 mmol/L   Glucose, Bld 115 (H) 70 - 99 mg/dL    Comment: Glucose reference range  applies only to samples taken after fasting for at least 8 hours.   BUN 13 8 - 23 mg/dL   Creatinine, Ser 8.85 0.61 - 1.24 mg/dL   Calcium  9.1 8.9 - 10.3 mg/dL   Total Protein 7.3 6.5 - 8.1 g/dL   Albumin 4.1 3.5 - 5.0 g/dL   AST 40 15 - 41 U/L   ALT 27  0 - 44 U/L   Alkaline Phosphatase 122 38 - 126 U/L   Total Bilirubin 1.8 (H) 0.0 - 1.2 mg/dL   GFR, Estimated >39 >39 mL/min    Comment: (NOTE) Calculated using the CKD-EPI Creatinine Equation (2021)    Anion gap 15 5 - 15    Comment: Performed at [facility], [address] [place], [place] [address]  CBC     Status: None   Collection Time: 05/08/24  3:19 PM  Result Value Ref Range   WBC 10.4 4.0 - 10.5 K/uL   RBC 4.85 4.22 - 5.81 MIL/uL   Hemoglobin 14.9 13.0 - 17.0 g/dL   HCT 57.6 60.9 - 47.9 %   MCV 87.2 80.0 - 100.0 fL   MCH 30.7 26.0 - 34.0 pg   MCHC 35.2 30.0 - 36.0 g/dL   RDW 86.4 88.4 - 84.4 %   Platelets 175 150 - 400 K/uL   nRBC 0.0 0.0 - 0.2 %    Comment: Performed at [facility], [address] [place], [place] [address]  Urinalysis, Routine w reflex microscopic -Urine, Unspecified Source     Status: Abnormal   Collection Time: 05/08/24  4:40 PM  Result Value Ref Range   Color, Urine YELLOW YELLOW   APPearance CLEAR CLEAR   Specific Gravity, Urine 1.021 1.005 - 1.030   pH 5.5 5.0 - 8.0   Glucose, UA NEGATIVE NEGATIVE mg/dL   Hgb urine dipstick NEGATIVE NEGATIVE   Bilirubin Urine NEGATIVE NEGATIVE   Ketones, ur 15 (A) NEGATIVE mg/dL   Protein, ur 30 (A) NEGATIVE mg/dL   Nitrite NEGATIVE NEGATIVE   Leukocytes,Ua NEGATIVE NEGATIVE   RBC / HPF 0-5 0 - 5 RBC/hpf   WBC, UA 0-5 0 - 5 WBC/hpf   Bacteria, UA NONE SEEN NONE SEEN   Squamous Epithelial / HPF 0-5 0 - 5 /HPF   Mucus PRESENT     Comment: Performed at [facility], [address], [place], [place] [address]  Lactic acid, plasma     Status: None   Collection Time: 05/08/24  5:36 PM  Result  Value Ref Range   Lactic Acid, Venous 1.0 0.5 - 1.9 mmol/L    Comment: Performed at [facility], [address] [place], [place], [place] [address]  Occult blood card to lab, stool RN will collect     Status: None   Collection Time: 05/08/24  6:02 PM  Result Value Ref Range   Fecal Occult Bld NEGATIVE NEGATIVE    Comment: Performed at [facility], [address] [place], [place], [place] [address]  CBC with Differential/Platelet     Status: Abnormal   Collection Time: 05/09/24  7:39 AM  Result Value Ref Range   WBC 7.1 4.0 - 10.5 K/uL   RBC 4.24 4.22 - 5.81 MIL/uL   Hemoglobin 12.8 (L) 13.0 - 17.0 g/dL   HCT 61.5 (L) 60.9 - 47.9 %   MCV 90.6 80.0 - 100.0 fL   MCH 30.2 26.0 - 34.0 pg   MCHC 33.3 30.0 - 36.0 g/dL   RDW 86.3 88.4 - 84.4 %   Platelets 158 150 - 400 K/uL   nRBC 0.0 0.0 - 0.2 %   Neutrophils Relative % 78 %   Neutro Abs 5.5 1.7 - 7.7 K/uL   Lymphocytes Relative 6 %   Lymphs Abs 0.5 (L) 0.7 - 4.0 K/uL   Monocytes Relative 13 %   Monocytes Absolute 0.9 0.1 - 1.0 K/uL  Eosinophils Relative 3 %   Eosinophils Absolute 0.2 0.0 - 0.5 K/uL   Basophils Relative 0 %   Basophils Absolute 0.0 0.0 - 0.1 K/uL   Immature Granulocytes 0 %   Abs Immature Granulocytes 0.02 0.00 - 0.07 K/uL    Comment: Performed at [facility], [address], [place], [place] [address]  Comprehensive metabolic panel     Status: Abnormal   Collection Time: 05/09/24  7:39 AM  Result Value Ref Range   Sodium 134 (L) 135 - 145 mmol/L   Potassium 4.1 3.5 - 5.1 mmol/L   Chloride 100 98 - 111 mmol/L   CO2 25 22 - 32 mmol/L   Glucose, Bld 122 (H) 70 - 99 mg/dL    Comment: Glucose reference range applies only to samples taken after fasting for at least 8 hours.   BUN 14 8 - 23 mg/dL   Creatinine, Ser 8.79 0.61 - 1.24 mg/dL   Calcium  8.3 (L) 8.9 - 10.3 mg/dL   Total Protein 6.3 (L) 6.5 - 8.1 g/dL   Albumin 3.6 3.5 - 5.0 g/dL   AST 43 (H) 15 - 41 U/L   ALT  32 0 - 44 U/L   Alkaline Phosphatase 145 (H) 38 - 126 U/L   Total Bilirubin 1.1 0.0 - 1.2 mg/dL   GFR, Estimated >39 >39 mL/min    Comment: (NOTE) Calculated using the CKD-EPI Creatinine Equation (2021)    Anion gap 10 5 - 15    Comment: Performed at [facility], [address], [place], [place] [address]  Magnesium      Status: Abnormal   Collection Time: 05/09/24  7:39 AM  Result Value Ref Range   Magnesium  2.7 (H) 1.7 - 2.4 mg/dL    Comment: Performed at [facility], [address], [place], [place] [address]  Phosphorus     Status: Abnormal   Collection Time: 05/09/24  7:39 AM  Result Value Ref Range   Phosphorus 2.2 (L) 2.5 - 4.6 mg/dL    Comment: Performed at [facility], [address], [place], [place] [address]   US  Abdomen Limited RUQ (LIVER/GB) Result Date: 05/09/2024 EXAM: Right Upper Quadrant Abdominal Ultrasound 05/09/2024 06:09:25 AM TECHNIQUE: Real-time ultrasonography of the right upper quadrant of the abdomen was performed. COMPARISON: Comparison is made with CT with IV contrast dated 05/08/2024 at 06:09 PM. CLINICAL HISTORY: Acute cholecystitis. FINDINGS: LIVER: The liver demonstrates mildly increased echogenicity compatible with mild steatosis, which was seen on CT. There is a 1.4 x 1.8 x 1.5 cm simple cyst in the tip of segment 6 also seen on CT. Other scattered, smaller hepatic cysts were not redemonstrated on this exam. No other significant liver abnormality. The hepatic portal vein measures prominent at 15 mm but has a normal directional flow. No intrahepatic biliary ductal dilatation. BILIARY SYSTEM: Gallbladder wall thickness measures 0.6 cm. The gallbladder is filled with sludge and stones, largest measured stone is 1.5 cm. There is a trace of pericholecystic fluid. There is a positive sonographic Murphy sign. Findings are consistent with cholelithiasis and acute cholecystitis. The common bile duct measures  0.4 cm. RIGHT KIDNEY: Visualized portions of the right kidney appear more echogenic than the liver, suspicious for medical renal disease. No hydronephrosis is seen or other focal abnormality although dedicated right kidney images were not obtained. OTHER: No right upper quadrant ascites. IMPRESSION: 1. Findings consistent with cholelithiasis and acute cholecystitis. 2. Right kidney appears more echogenic than the  liver, suspicious for medical renal disease. Dedicated images of the right kidney, however, were not obtained. Further evaluation is recommended. Electronically signed by: Francis Quam MD 05/09/2024 06:30 AM EST RP Workstation: [id number]   CT ABDOMEN PELVIS W CONTRAST Result Date: 05/08/2024 EXAM: CT ABDOMEN AND PELVIS WITH CONTRAST 05/08/2024 06:13:34 PM TECHNIQUE: CT of the abdomen and pelvis was performed with the administration of 100 mL of iohexol  (OMNIPAQUE ) 300 MG/ML solution. Multiplanar reformatted images are provided for review. Automated exposure control, iterative reconstruction, and/or weight-based adjustment of the mA/kV was utilized to reduce the radiation dose to as low as reasonably achievable. COMPARISON: None available. CLINICAL HISTORY: Abdominal pain, acute, nonlocalized; progressive generalized abd pain, constipation, h/o hernia repair no other abd surgery. FINDINGS: LOWER CHEST: No acute abnormality. LIVER: The liver is unremarkable. GALLBLADDER AND BILE DUCTS: Gallbladder wall thickening with surrounding inflammatory changes is noted concerning for cholecystitis. Possible cholelithiasis. No biliary ductal dilatation. SPLEEN: No acute abnormality. PANCREAS: No acute abnormality. ADRENAL GLANDS: No acute abnormality. KIDNEYS, URETERS AND BLADDER: Bilateral renal cysts. Per consensus, no follow-up is needed for simple Bosniak type 1 and 2 renal cysts, unless the patient has a malignancy history or risk factors. No stones in the kidneys or ureters. No hydronephrosis. No perinephric  or periureteral stranding. Urinary bladder is unremarkable. GI AND BOWEL: Small hiatal hernia. Stomach demonstrates no acute abnormality. There is no bowel obstruction. PERITONEUM AND RETROPERITONEUM: No ascites. No free air. VASCULATURE: Aortic atherosclerosis. Aorta is normal in caliber. LYMPH NODES: No lymphadenopathy. REPRODUCTIVE ORGANS: No acute abnormality. BONES AND SOFT TISSUES: No acute osseous abnormality. No focal soft tissue abnormality. IMPRESSION: 1. Gallbladder wall thickening with surrounding inflammatory changes, concerning for cholecystitis. Possible cholelithiasis. 2. Small hiatal hernia. 3. Aortic atherosclerosis. Electronically signed by: Lynwood Seip MD 05/08/2024 06:25 PM EST RP Workstation: [id number]   DG Chest 2 View Result Date: 05/08/2024 CLINICAL DATA:  Cough, upper abdominal pain EXAM: CHEST - 2 VIEW COMPARISON:  02/09/2023 FINDINGS: Frontal and lateral views of the chest demonstrate a stable cardiac silhouette stable ectasia of the thoracic aorta. No airspace disease, effusion, or pneumothorax. No acute bony abnormalities. IMPRESSION: 1. No acute intrathoracic process. Electronically Signed   By: Ozell Daring M.D.   On: 05/08/2024 18:15      Assessment/Plan Cholecystitis The patient has been, examined, labs, vitals, chart, and imaging personally reviewed.  The patient has evidence of cholecystitis.  His TB was initially elevated, but has normalized today.  We will plan to allow his Eliquis  to continue to wear off and proceed with lap chole with IOC tomorrow.  He has no cardiac symptoms and functions without limitations in his normal day as well as his physical activities.  He may have CLD today and NPO p MN. I have explained the procedure, risks, and aftercare of cholecystectomy.  Risks include but are not limited to bleeding, infection, wound problems, anesthesia, diarrhea, bile leak, injury to common bile duct/liver/intestine.  He seems to understand and agrees to  proceed.  FEN - CLD, NPO p MN VTE - ok for chemical prophylaxis from our standpoint ID - Rocephin  CAD, s/p PCI in 2024, Eliquis  on hold  I reviewed ED provider notes, hospitalist notes, last 24 h vitals and pain scores, last 48 h intake and output, last 24 h labs and trends, and last 24 h imaging results.  Burnard FORBES Banter, [facility] Surgery 05/09/2024, 9:54 AM Please see Amion for pager number during day hours 7:00am-4:30pm or 7:00am -11:30am on weekends      [  1]  Allergies Allergen Reactions   Watermelon [Citrullus Vulgaris] Anaphylaxis   Banana     Inside of mouth itch   Erythromycin Itching

## 2024-05-09 NOTE — Discharge Instructions (Signed)

## 2024-05-09 NOTE — Care Management Obs Status (Signed)
 MEDICARE OBSERVATION STATUS NOTIFICATION   Patient Details  Name: Andrew [name] MRN: [id number] Date of Birth: [birth year]   Medicare Observation Status Notification Given:  Yes    Alfonse JONELLE Rex, RN 05/09/2024, 2:58 PM

## 2024-05-09 NOTE — Progress Notes (Signed)
 [facility] Progress Note Patient: Andrew [name] FMW:[id number] DOB: 1955-07-23  DOA: 05/08/2024 DOS: the patient was seen and examined on 05/09/2024  Brief Hospital Course: Patient with PMH of OSA on CPAP, CAD, HLD, PAF on Eliquis  presented to the hospital with complaints of right sided pain. Found to have cholecystitis. Scheduled for lap chole tomorrow with general surgery.  Assessment and Plan: Acute cholecystitis secondary to gallstone. CT scan with evidence of gallstones but Ultrasound and CT scan negative for any CBD dilation. LFTs stable. Initiated on IV antibiotic. Patient reports that he is allergic to mycin type antibiotic. General surgery consulted. Currently scheduled for lap chole tomorrow. N.p.o. after midnight. Gentle IV hydration.  CAD. Currently no evidence of angina. Monitor.  Paroxysmal A-fib. On anticoagulation with Eliquis . Rate controlled for now. Discontinue telemetry. Holding Eliquis  for now. Resume postop as per surgery.  HLD. Statin on hold for now.  Constipation. Patient reports that he has not had a bowel movement in last 2 weeks which was satisfactory. CT scan does not show any evidence of significant stool burden. Continue Senokot for now.  OSA. Currently CPAP nightly.  Subjective: Abdominal pain still present.  No nausea no vomiting.  No fever no chills.  Breathing okay.  Physical Exam: Clear to auscultation. S1-S2 present.  Some bowel sounds present  No edema. Right upper quadrant tenderness present.  Data Reviewed: I have Reviewed nursing notes, Vitals, and Lab results. Since last encounter, pertinent lab results CBC and BMP   . I have ordered test including CBC and BMP  .   Disposition: Status is: Observation  Place and maintain sequential compression device Start: 05/09/24 1521  Family Communication: Discussed with family at bedside Level of care: Telemetry switch to MedSurg Vitals:   05/09/24 0602 05/09/24 0940  05/09/24 1326 05/09/24 1818  BP: 118/82 (!) 133/99 (!) 137/92 (!) 140/82  Pulse: 73 63 65 70  Resp: 18 18 18 18   Temp: 98.3 F (36.8 C) 97.9 F (36.6 C) 98 F (36.7 C) 98.3 F (36.8 C)  TempSrc: Oral Oral Oral Axillary  SpO2: 93% 92% 98% 99%  Weight:      Height:         Author: Yetta Blanch, MD 05/09/2024 6:23 PM  Please look on [email or web address] to find out who is on call.

## 2024-05-09 NOTE — Progress Notes (Signed)
° ° °  PROCEDURAL EXPEDITER PROGRESS NOTE  Patient Name: Andrew [name]  DOB:[birth year] Date of Admission: 05/08/2024  Date of Assessment:05/09/2024   -------------------------------------------------------------------------------------------------------------------   Brief clinical summary: Pt is a inpatient with a Hx of  A-fib on eliquis  and lipitor.  OSA on CPAPCAD with stent placement in 2024.  Having Lap cholecystectomy on 05/10/2024  Orders in place:  No   Communication with surgical team if no orders: Reached out to Dr. Debby for consent orders.    Labs, test, and orders reviewed: yes  Requires surgical clearance:  No  What type of clearance: Sees dr. EMERSON Balding for cardiology  Clearance received: n/a  Barriers noted:n/a   Intervention provided by [facility] team: n/a  Barrier resolved:  not applicable   -------------------------------------------------------------------------------------------------------------------  [employer], Andrew [name] Please contact us  directly via secure chat (search for [facility]) or by calling us  at [phone] [facility]).

## 2024-05-09 NOTE — TOC Initial Note (Signed)
 Transition of Care [facility]) - Initial/Assessment Note    Patient Details  Name: Andrew [name] MRN: [id number] Date of Birth: 12-29-1955  Transition of Care [facility]) CM/SW Contact:    Alfonse JONELLE Rex, RN Phone Number: 05/09/2024, 3:01 PM  Clinical Narrative:     Met with patient at bedside to introduce role of  INPT CM/NCM  and review for dc planning, patient reports he resides in a private residence, has good support system, reports he is independent at baseline, no current home care services or home DME, PCP in file. MOON reviewed with patient, patient declined to sign, copy of MOON provided to patient. NCM will continue to follow for dc needs.               Expected Discharge Plan: Home/Self Care Barriers to Discharge: Continued Medical Work up   Patient Goals and CMS Choice Patient states their goals for this hospitalization and ongoing recovery are:: return home          Expected Discharge Plan and Services       Living arrangements for the past 2 months: Single Family Home                                      Prior Living Arrangements/Services Living arrangements for the past 2 months: Single Family Home Lives with:: Significant Other   Do you feel safe going back to the place where you live?: Yes      Need for Family Participation in Patient Care: Yes (Comment)     Criminal Activity/Legal Involvement Pertinent to Current Situation/Hospitalization: No - Comment as needed  Activities of Daily Living   ADL Screening (condition at time of admission) Independently performs ADLs?: Yes (appropriate for developmental age) Is the patient deaf or have difficulty hearing?: No Does the patient have difficulty seeing, even when wearing glasses/contacts?: No Does the patient have difficulty concentrating, remembering, or making decisions?: No  Permission Sought/Granted                  Emotional Assessment Appearance:: Appears stated  age Attitude/Demeanor/Rapport: Engaged Affect (typically observed): Accepting Orientation: : Oriented to Self, Oriented to Place, Oriented to  Time, Oriented to Situation Alcohol / Substance Use: Not Applicable Psych Involvement: No (comment)  Admission diagnosis:  Acute cholecystitis [K81.0] Patient Active Problem List   Diagnosis Date Noted   Acute cholecystitis 05/08/2024   Hypercoagulable state due to paroxysmal atrial fibrillation (HCC) 09/15/2023   Hypercholesterolemia 06/18/2023   OSA on CPAP 06/18/2023   Temporomandibular joint syndrome 06/18/2023   Paroxysmal atrial fibrillation (HCC) 02/16/2023   Dyslipidemia (high LDL; low HDL) 02/16/2023   Coronary artery disease of native artery of native heart with stable angina pectoris 02/16/2023   Nocturnal leg cramps 04/04/2017   Peripheral neuropathy 12/05/2016   PCP:  Charlott Dorn LABOR, MD Pharmacy:   [id number] - [place], [place] - [address] [place] [address] Phone: [phone] Fax: [phone]  [facility] [address] [place] [address] Phone: [phone] Fax: [phone]  [facility] [address] [place] [address] Phone: [phone] Fax: [phone]     Social Drivers of Health (SDOH) Social History: SDOH Screenings   Food Insecurity: No Food Insecurity (05/08/2024)  Housing: Low Risk (05/08/2024)  Transportation Needs: No Transportation Needs (05/08/2024)  Utilities: Not At Risk (05/08/2024)  Social Connections: Socially Integrated (05/08/2024)  Tobacco Use: Low Risk (05/09/2024)   SDOH Interventions:     Readmission Risk Interventions     No data to display

## 2024-05-10 ENCOUNTER — Observation Stay (HOSPITAL_COMMUNITY)

## 2024-05-10 ENCOUNTER — Observation Stay (HOSPITAL_COMMUNITY): Admitting: Anesthesiology

## 2024-05-10 ENCOUNTER — Encounter (HOSPITAL_COMMUNITY): Payer: Self-pay | Admitting: Internal Medicine

## 2024-05-10 ENCOUNTER — Encounter (HOSPITAL_COMMUNITY): Admission: EM | Disposition: A | Payer: Self-pay | Source: Home / Self Care | Attending: Internal Medicine

## 2024-05-10 DIAGNOSIS — K449 Diaphragmatic hernia without obstruction or gangrene: Secondary | ICD-10-CM | POA: Diagnosis present

## 2024-05-10 DIAGNOSIS — G4700 Insomnia, unspecified: Secondary | ICD-10-CM | POA: Diagnosis present

## 2024-05-10 DIAGNOSIS — I480 Paroxysmal atrial fibrillation: Secondary | ICD-10-CM | POA: Diagnosis present

## 2024-05-10 DIAGNOSIS — G4733 Obstructive sleep apnea (adult) (pediatric): Secondary | ICD-10-CM | POA: Diagnosis present

## 2024-05-10 DIAGNOSIS — K219 Gastro-esophageal reflux disease without esophagitis: Secondary | ICD-10-CM | POA: Diagnosis present

## 2024-05-10 DIAGNOSIS — I10 Essential (primary) hypertension: Secondary | ICD-10-CM | POA: Diagnosis present

## 2024-05-10 DIAGNOSIS — Z79899 Other long term (current) drug therapy: Secondary | ICD-10-CM | POA: Diagnosis not present

## 2024-05-10 DIAGNOSIS — Z7901 Long term (current) use of anticoagulants: Secondary | ICD-10-CM | POA: Diagnosis not present

## 2024-05-10 DIAGNOSIS — I2510 Atherosclerotic heart disease of native coronary artery without angina pectoris: Secondary | ICD-10-CM | POA: Diagnosis present

## 2024-05-10 DIAGNOSIS — Z881 Allergy status to other antibiotic agents status: Secondary | ICD-10-CM | POA: Diagnosis not present

## 2024-05-10 DIAGNOSIS — Z955 Presence of coronary angioplasty implant and graft: Secondary | ICD-10-CM | POA: Diagnosis not present

## 2024-05-10 DIAGNOSIS — K5900 Constipation, unspecified: Secondary | ICD-10-CM | POA: Diagnosis present

## 2024-05-10 DIAGNOSIS — K82A1 Gangrene of gallbladder in cholecystitis: Secondary | ICD-10-CM | POA: Diagnosis present

## 2024-05-10 DIAGNOSIS — G629 Polyneuropathy, unspecified: Secondary | ICD-10-CM | POA: Diagnosis present

## 2024-05-10 DIAGNOSIS — Z91018 Allergy to other foods: Secondary | ICD-10-CM | POA: Diagnosis not present

## 2024-05-10 DIAGNOSIS — I700 Atherosclerosis of aorta: Secondary | ICD-10-CM | POA: Diagnosis present

## 2024-05-10 DIAGNOSIS — Z85828 Personal history of other malignant neoplasm of skin: Secondary | ICD-10-CM | POA: Diagnosis not present

## 2024-05-10 DIAGNOSIS — R1011 Right upper quadrant pain: Secondary | ICD-10-CM | POA: Diagnosis present

## 2024-05-10 DIAGNOSIS — K810 Acute cholecystitis: Secondary | ICD-10-CM | POA: Diagnosis not present

## 2024-05-10 DIAGNOSIS — E7800 Pure hypercholesterolemia, unspecified: Secondary | ICD-10-CM | POA: Diagnosis present

## 2024-05-10 DIAGNOSIS — K429 Umbilical hernia without obstruction or gangrene: Secondary | ICD-10-CM | POA: Diagnosis present

## 2024-05-10 DIAGNOSIS — K8066 Calculus of gallbladder and bile duct with acute and chronic cholecystitis without obstruction: Secondary | ICD-10-CM | POA: Diagnosis present

## 2024-05-10 HISTORY — PX: CHOLECYSTECTOMY: SHX55

## 2024-05-10 HISTORY — PX: UMBILICAL HERNIA REPAIR: SHX196

## 2024-05-10 LAB — CBC
HCT: 41.7 % (ref 39.0–52.0)
Hemoglobin: 13.6 g/dL (ref 13.0–17.0)
MCH: 30.4 pg (ref 26.0–34.0)
MCHC: 32.6 g/dL (ref 30.0–36.0)
MCV: 93.1 fL (ref 80.0–100.0)
Platelets: 201 K/uL (ref 150–400)
RBC: 4.48 MIL/uL (ref 4.22–5.81)
RDW: 13.5 % (ref 11.5–15.5)
WBC: 8.9 K/uL (ref 4.0–10.5)
nRBC: 0 % (ref 0.0–0.2)

## 2024-05-10 LAB — CBC WITH DIFFERENTIAL/PLATELET
Abs Immature Granulocytes: 0.01 K/uL (ref 0.00–0.07)
Basophils Absolute: 0 K/uL (ref 0.0–0.1)
Basophils Relative: 1 %
Eosinophils Absolute: 0.3 K/uL (ref 0.0–0.5)
Eosinophils Relative: 5 %
HCT: 38.8 % — ABNORMAL LOW (ref 39.0–52.0)
Hemoglobin: 12.9 g/dL — ABNORMAL LOW (ref 13.0–17.0)
Immature Granulocytes: 0 %
Lymphocytes Relative: 14 %
Lymphs Abs: 0.7 K/uL (ref 0.7–4.0)
MCH: 30 pg (ref 26.0–34.0)
MCHC: 33.2 g/dL (ref 30.0–36.0)
MCV: 90.2 fL (ref 80.0–100.0)
Monocytes Absolute: 0.6 K/uL (ref 0.1–1.0)
Monocytes Relative: 11 %
Neutro Abs: 3.8 K/uL (ref 1.7–7.7)
Neutrophils Relative %: 69 %
Platelets: 167 K/uL (ref 150–400)
RBC: 4.3 MIL/uL (ref 4.22–5.81)
RDW: 13.4 % (ref 11.5–15.5)
WBC: 5.5 K/uL (ref 4.0–10.5)
nRBC: 0 % (ref 0.0–0.2)

## 2024-05-10 LAB — COMPREHENSIVE METABOLIC PANEL WITH GFR
ALT: 34 U/L (ref 0–44)
AST: 43 U/L — ABNORMAL HIGH (ref 15–41)
Albumin: 3.5 g/dL (ref 3.5–5.0)
Alkaline Phosphatase: 141 U/L — ABNORMAL HIGH (ref 38–126)
Anion gap: 9 (ref 5–15)
BUN: 10 mg/dL (ref 8–23)
CO2: 25 mmol/L (ref 22–32)
Calcium: 8.6 mg/dL — ABNORMAL LOW (ref 8.9–10.3)
Chloride: 106 mmol/L (ref 98–111)
Creatinine, Ser: 1.02 mg/dL (ref 0.61–1.24)
GFR, Estimated: >60 mL/min (ref >60)
Glucose, Bld: 94 mg/dL (ref 70–99)
Potassium: 4.3 mmol/L (ref 3.5–5.1)
Sodium: 140 mmol/L (ref 135–145)
Total Bilirubin: 0.6 mg/dL (ref 0.0–1.2)
Total Protein: 6.3 g/dL — ABNORMAL LOW (ref 6.5–8.1)

## 2024-05-10 LAB — SURGICAL PCR SCREEN
MRSA, PCR: NEGATIVE
Staphylococcus aureus: NEGATIVE

## 2024-05-10 LAB — MAGNESIUM: Magnesium: 2.7 mg/dL — ABNORMAL HIGH (ref 1.7–2.4)

## 2024-05-10 SURGERY — LAPAROSCOPIC CHOLECYSTECTOMY WITH INTRAOPERATIVE CHOLANGIOGRAM
Anesthesia: General

## 2024-05-10 MED ORDER — OXIDIZED CELLULOSE EX PADS
MEDICATED_PAD | CUTANEOUS | Status: DC | PRN
  Administered 2024-05-10 (×2): 1 via TOPICAL

## 2024-05-10 MED ORDER — SUGAMMADEX SODIUM 200 MG/2ML IV SOLN
INTRAVENOUS | Status: DC | PRN
  Administered 2024-05-10: 200 mg via INTRAVENOUS

## 2024-05-10 MED ORDER — FENTANYL CITRATE (PF) 100 MCG/2ML IJ SOLN
INTRAMUSCULAR | Status: DC | PRN
  Administered 2024-05-10: 100 ug via INTRAVENOUS
  Administered 2024-05-10 (×2): 50 ug via INTRAVENOUS

## 2024-05-10 MED ORDER — PROPOFOL 10 MG/ML IV BOLUS
INTRAVENOUS | Status: DC | PRN
  Administered 2024-05-10: 200 mg via INTRAVENOUS

## 2024-05-10 MED ORDER — MIDAZOLAM HCL 5 MG/5ML IJ SOLN
INTRAMUSCULAR | Status: DC | PRN
  Administered 2024-05-10: 2 mg via INTRAVENOUS

## 2024-05-10 MED ORDER — HYDROMORPHONE HCL 1 MG/ML IJ SOLN
INTRAMUSCULAR | Status: DC | PRN
  Administered 2024-05-10: .6 mg via INTRAVENOUS

## 2024-05-10 MED ORDER — ONDANSETRON HCL 4 MG/2ML IJ SOLN
INTRAMUSCULAR | Status: DC | PRN
  Administered 2024-05-10: 4 mg via INTRAVENOUS

## 2024-05-10 MED ORDER — SODIUM CHLORIDE 0.9 % IV SOLN
2.0000 g | INTRAVENOUS | Status: DC
  Administered 2024-05-11: 2 g via INTRAVENOUS
  Filled 2024-05-10: qty 20

## 2024-05-10 MED ORDER — ROCURONIUM BROMIDE 100 MG/10ML IV SOLN
INTRAVENOUS | Status: DC | PRN
  Administered 2024-05-10: 20 mg via INTRAVENOUS
  Administered 2024-05-10: 50 mg via INTRAVENOUS

## 2024-05-10 MED ORDER — FENTANYL CITRATE (PF) 50 MCG/ML IJ SOSY
PREFILLED_SYRINGE | INTRAMUSCULAR | Status: AC
  Filled 2024-05-10: qty 1

## 2024-05-10 MED ORDER — DROPERIDOL 2.5 MG/ML IJ SOLN: 0.6250 mg | Freq: Once | INTRAMUSCULAR | Status: DC | PRN

## 2024-05-10 MED ORDER — FENTANYL CITRATE (PF) 50 MCG/ML IJ SOSY
25.0000 ug | PREFILLED_SYRINGE | INTRAMUSCULAR | Status: DC | PRN
  Administered 2024-05-10 (×3): 50 ug via INTRAVENOUS

## 2024-05-10 MED ORDER — HYDROMORPHONE HCL 1 MG/ML IJ SOLN: 0.5000 mg | INTRAMUSCULAR | Status: DC | PRN

## 2024-05-10 MED ORDER — DEXAMETHASONE SODIUM PHOSPHATE 4 MG/ML IJ SOLN
INTRAMUSCULAR | Status: DC | PRN
  Administered 2024-05-10: 5 mg via INTRAVENOUS

## 2024-05-10 MED ORDER — ACETAMINOPHEN 500 MG PO TABS
1000.0000 mg | ORAL_TABLET | Freq: Four times a day (QID) | ORAL | Status: DC
  Administered 2024-05-10 – 2024-05-12 (×7): 1000 mg via ORAL
  Filled 2024-05-10 (×8): qty 2

## 2024-05-10 MED ORDER — LIDOCAINE HCL (CARDIAC) PF 100 MG/5ML IV SOSY
PREFILLED_SYRINGE | INTRAVENOUS | Status: DC | PRN
  Administered 2024-05-10: 80 mg via INTRAVENOUS

## 2024-05-10 MED ORDER — BUPIVACAINE-EPINEPHRINE (PF) 0.25% -1:200000 IJ SOLN
INTRAMUSCULAR | Status: AC
  Filled 2024-05-10: qty 30

## 2024-05-10 MED ORDER — OXYCODONE HCL 5 MG/5ML PO SOLN: 5.0000 mg | Freq: Once | ORAL | Status: DC | PRN

## 2024-05-10 MED ORDER — OXYCODONE HCL 5 MG PO TABS
5.0000 mg | ORAL_TABLET | ORAL | Status: DC | PRN
  Administered 2024-05-10 – 2024-05-11 (×2): 10 mg via ORAL
  Filled 2024-05-10 (×3): qty 2

## 2024-05-10 MED ORDER — SIMETHICONE 80 MG PO CHEW
80.0000 mg | CHEWABLE_TABLET | Freq: Four times a day (QID) | ORAL | Status: DC
  Administered 2024-05-10 – 2024-05-12 (×7): 80 mg via ORAL
  Filled 2024-05-10 (×7): qty 1

## 2024-05-10 MED ORDER — OXYCODONE HCL 5 MG PO TABS: 5.0000 mg | ORAL_TABLET | Freq: Once | ORAL | Status: DC | PRN

## 2024-05-10 MED ORDER — LACTATED RINGERS IV SOLN: INTRAVENOUS | Status: DC | PRN

## 2024-05-10 MED ORDER — SODIUM CHLORIDE 0.9 % IR SOLN
Status: DC | PRN
  Administered 2024-05-10: 1000 mL

## 2024-05-10 MED ADMIN — Bupivacaine Inj 0.25% w/ Epinephrine 1:200000: 30 mL | NDC 63323046157

## 2024-05-10 SURGICAL SUPPLY — 36 items
BAG COUNTER SPONGE SURGICOUNT (BAG) IMPLANT
BIOPATCH WHT 1IN DISK W/4.0 H (GAUZE/BANDAGES/DRESSINGS) IMPLANT
CHLORAPREP W/TINT 26 (MISCELLANEOUS) ×1 IMPLANT
CLIP APPLIE 5 13 M/L LIGAMAX5 (MISCELLANEOUS) ×1 IMPLANT
COVER MAYO STAND XLG (MISCELLANEOUS) ×1 IMPLANT
COVER SURGICAL LIGHT HANDLE (MISCELLANEOUS) ×2 IMPLANT
DERMABOND ADVANCED .7 DNX12 (GAUZE/BANDAGES/DRESSINGS) ×1 IMPLANT
DEVICE TROCAR PUNCTURE CLOSURE (ENDOMECHANICALS) IMPLANT
DRAPE C-ARM 42X120 X-RAY (DRAPES) IMPLANT
DRAPE LAPAROSCOPIC ABDOMINAL (DRAPES) ×1 IMPLANT
DRSG TEGADERM 2-3/8X2-3/4 SM (GAUZE/BANDAGES/DRESSINGS) IMPLANT
ELECT REM PT RETURN 15FT ADLT (MISCELLANEOUS) ×1 IMPLANT
ENDOLOOP SUT PDS II 0 18 (SUTURE) IMPLANT
EVACUATOR SILICONE 100CC (DRAIN) IMPLANT
GLOVE BIO SURGEON STRL SZ 6.5 (GLOVE) ×1 IMPLANT
GLOVE INDICATOR 6.5 STRL GRN (GLOVE) ×2 IMPLANT
GOWN STRL REUS W/ TWL XL LVL3 (GOWN DISPOSABLE) ×3 IMPLANT
IRRIGATION SUCT STRKRFLW 2 WTP (MISCELLANEOUS) ×1 IMPLANT
IV CATH 14GX2 1/4 (CATHETERS) IMPLANT
KIT BASIN OR (CUSTOM PROCEDURE TRAY) ×1 IMPLANT
KIT IMAGING PINPOINTPAQ (MISCELLANEOUS) IMPLANT
KIT TURNOVER KIT A (KITS) ×1 IMPLANT
PENCIL SMOKE EVACUATOR (MISCELLANEOUS) IMPLANT
SCISSORS LAP 5X35 DISP (ENDOMECHANICALS) ×1 IMPLANT
SET CHOLANGIOGRAPH MIX (MISCELLANEOUS) IMPLANT
SET TUBE SMOKE EVAC HIGH FLOW (TUBING) ×1 IMPLANT
SLEEVE ADV FIXATION 5X100MM (TROCAR) ×2 IMPLANT
SUT ETHILON 2 0 PS N (SUTURE) IMPLANT
SUT VIC AB 2-0 SH 27X BRD (SUTURE) IMPLANT
SUT VIC AB 4-0 PS2 18 (SUTURE) ×1 IMPLANT
SUT VICRYL 0 UR6 27IN ABS (SUTURE) ×1 IMPLANT
SYSTEM BAG RETRIEVAL 10MM (BASKET) ×1 IMPLANT
TOWEL OR DSP ST BLU DLX 10/PK (DISPOSABLE) ×1 IMPLANT
TRAY LAPAROSCOPIC (CUSTOM PROCEDURE TRAY) ×1 IMPLANT
TROCAR ADV FIXATION 5X100MM (TROCAR) ×1 IMPLANT
TROCAR BALLN 12MMX100 BLUNT (TROCAR) IMPLANT

## 2024-05-10 NOTE — Anesthesia Postprocedure Evaluation (Signed)
 Anesthesia Post Note  Patient: JEMARION ROYCROFT  Procedure(s) Performed: LAPAROSCOPIC CHOLECYSTECTOMY WITH INTRAOPERATIVE CHOLANGIOGRAM REPAIR, HERNIA, UMBILICAL, ADULT     Patient location during evaluation: PACU Anesthesia Type: General Level of consciousness: awake and alert Pain management: pain level controlled Vital Signs Assessment: post-procedure vital signs reviewed and stable Respiratory status: spontaneous breathing, nonlabored ventilation, respiratory function stable and patient connected to nasal cannula oxygen Cardiovascular status: blood pressure returned to baseline and stable Postop Assessment: no apparent nausea or vomiting Anesthetic complications: no   No notable events documented.  Last Vitals:  Vitals:   05/10/24 1430 05/10/24 1448  BP: (!) 137/98 115/87  Pulse: 65 65  Resp: 20 18  Temp: (!) 36.4 C   SpO2: 94% 99%    Last Pain:  Vitals:   05/10/24 1448  TempSrc: Oral  PainSc: 7                  Epifanio Lamar BRAVO

## 2024-05-10 NOTE — Anesthesia Procedure Notes (Signed)
 Procedure Name: Intubation Date/Time: 05/10/2024 11:10 AM  Performed by: Dartha Meckel, CRNAPre-anesthesia Checklist: Patient identified, Emergency Drugs available, Suction available and Patient being monitored Patient Re-evaluated:Patient Re-evaluated prior to induction Oxygen Delivery Method: Circle system utilized Preoxygenation: Pre-oxygenation with 100% oxygen Induction Type: IV induction Ventilation: Mask ventilation without difficulty Laryngoscope Size: Glidescope and 3 Grade View: Grade I Tube type: Oral Tube size: 7.5 mm Number of attempts: 1 Airway Equipment and Method: Stylet and Oral airway Placement Confirmation: ETT inserted through vocal cords under direct vision, positive ETCO2 and breath sounds checked- equal and bilateral Secured at: 22 cm Tube secured with: Tape Dental Injury: Teeth and Oropharynx as per pre-operative assessment

## 2024-05-10 NOTE — Progress Notes (Signed)
 Acute cholecystitis  Subjective: Pt ready for surgery, Elliquis held for 48h  Objective: Vital signs in last 24 hours: Temp:  [98 F (36.7 C)-98.4 F (36.9 C)] 98.4 F (36.9 C) (12/12 0955) Pulse Rate:  [65-74] 68 (12/12 0955) Resp:  [18] 18 (12/12 0955) BP: (127-142)/(82-94) 142/90 (12/12 0955) SpO2:  [95 %-99 %] 98 % (12/12 0955) Weight:  [94.3 kg] 94.3 kg (12/12 0955) Last BM Date : 05/08/24  Intake/Output from previous day: 12/11 0701 - 12/12 0700 In: 1540 [P.O.:1440; IV Piggyback:100] Out: -  Intake/Output this shift: No intake/output data recorded.  General appearance: alert and cooperative GI: soft, TTP RUQ  Lab Results:  Results for orders placed or performed during the hospital encounter of 05/08/24 (from the past 24 hours)  CBC with Differential/Platelet     Status: Abnormal   Collection Time: 05/10/24  4:38 AM  Result Value Ref Range   WBC 5.5 4.0 - 10.5 K/uL   RBC 4.30 4.22 - 5.81 MIL/uL   Hemoglobin 12.9 (L) 13.0 - 17.0 g/dL   HCT 61.1 (L) 60.9 - 47.9 %   MCV 90.2 80.0 - 100.0 fL   MCH 30.0 26.0 - 34.0 pg   MCHC 33.2 30.0 - 36.0 g/dL   RDW 86.5 88.4 - 84.4 %   Platelets 167 150 - 400 K/uL   nRBC 0.0 0.0 - 0.2 %   Neutrophils Relative % 69 %   Neutro Abs 3.8 1.7 - 7.7 K/uL   Lymphocytes Relative 14 %   Lymphs Abs 0.7 0.7 - 4.0 K/uL   Monocytes Relative 11 %   Monocytes Absolute 0.6 0.1 - 1.0 K/uL   Eosinophils Relative 5 %   Eosinophils Absolute 0.3 0.0 - 0.5 K/uL   Basophils Relative 1 %   Basophils Absolute 0.0 0.0 - 0.1 K/uL   Immature Granulocytes 0 %   Abs Immature Granulocytes 0.01 0.00 - 0.07 K/uL  Comprehensive metabolic panel with GFR     Status: Abnormal   Collection Time: 05/10/24  4:38 AM  Result Value Ref Range   Sodium 140 135 - 145 mmol/L   Potassium 4.3 3.5 - 5.1 mmol/L   Chloride 106 98 - 111 mmol/L   CO2 25 22 - 32 mmol/L   Glucose, Bld 94 70 - 99 mg/dL   BUN 10 8 - 23 mg/dL   Creatinine, Ser 8.97 0.61 - 1.24 mg/dL    Calcium  8.6 (L) 8.9 - 10.3 mg/dL   Total Protein 6.3 (L) 6.5 - 8.1 g/dL   Albumin 3.5 3.5 - 5.0 g/dL   AST 43 (H) 15 - 41 U/L   ALT 34 0 - 44 U/L   Alkaline Phosphatase 141 (H) 38 - 126 U/L   Total Bilirubin 0.6 0.0 - 1.2 mg/dL   GFR, Estimated >39 >39 mL/min   Anion gap 9 5 - 15  Magnesium      Status: Abnormal   Collection Time: 05/10/24  4:38 AM  Result Value Ref Range   Magnesium  2.7 (H) 1.7 - 2.4 mg/dL  Surgical pcr screen     Status: None   Collection Time: 05/10/24  5:32 AM   Specimen: Nasal Mucosa; Nasal Swab  Result Value Ref Range   MRSA, PCR NEGATIVE NEGATIVE   Staphylococcus aureus NEGATIVE NEGATIVE     Studies/Results Radiology     MEDS, Scheduled  [MAR Hold] atorvastatin   80 mg Oral Daily   [MAR Hold] ezetimibe   10 mg Oral Daily   indocyanine green  1.25 mg  Intravenous Once   [MAR Hold] omega-3 acid ethyl esters  2 g Oral BID   [MAR Hold] senna-docusate  1 tablet Oral BID   [MAR Hold] simethicone  80 mg Oral QID     Assessment: Acute cholecystitis   Plan: To OR for lap chole with IOC.  The anatomy & physiology of hepatobiliary & pancreatic function was discussed.  The pathophysiology of gallbladder dysfunction was discussed.  Natural history risks without surgery was discussed.   I feel the risks of no intervention will lead to serious problems that outweigh the operative risks; therefore, I recommended cholecystectomy to remove the pathology.  I explained laparoscopic techniques with possible need for an open approach.  Probable cholangiogram to evaluate the bilary tract was explained as well.    Risks such as bleeding, infection, abscess, bile leak, injury to other organs, need for further treatment, heart attack, death, and other risks were discussed.  I noted a good likelihood this will help address the problem.  Possibility that this will not correct all abdominal symptoms was explained.  Goals of post-operative recovery were discussed as well.  We will  work to minimize complications.  An educational handout further explaining the pathology and treatment options was given as well.  Questions were answered.  The patient expresses understanding & wishes to proceed with surgery.    LOS: 0 days    Bernarda JAYSON Ned, MD  Colorectal and General Surgery [facility]   Bernarda JAYSON Ned, MD [facility], [place]  High medical decision making   05/10/2024 10:26 AM

## 2024-05-10 NOTE — Transfer of Care (Signed)
 Immediate Anesthesia Transfer of Care Note  Patient: Andrew [name]  Procedure(s) Performed: LAPAROSCOPIC CHOLECYSTECTOMY WITH INTRAOPERATIVE CHOLANGIOGRAM REPAIR, HERNIA, UMBILICAL, ADULT  Patient Location: PACU  Anesthesia Type:General  Level of Consciousness: awake and alert   Airway & Oxygen Therapy: Patient Spontanous Breathing and Patient connected to nasal cannula oxygen  Post-op Assessment: Report given to RN and Post -op Vital signs reviewed and stable  Post vital signs: Reviewed and stable  Last Vitals:  Vitals Value Taken Time  BP 150/97 05/10/24 13:22  Temp    Pulse 65 05/10/24 13:23  Resp 13 05/10/24 13:23  SpO2 100 % 05/10/24 13:23  Vitals shown include unfiled device data.  Last Pain:  Vitals:   05/10/24 0955  TempSrc:   PainSc: 0-No pain         Complications: No notable events documented.

## 2024-05-10 NOTE — Progress Notes (Signed)
 [facility] Progress Note Patient: Andrew [name] FMW:[id number] DOB: 1955/10/26  DOA: 05/08/2024 DOS: the patient was seen and examined on 05/10/2024  Brief Hospital Course: Patient with PMH of OSA on CPAP, CAD, HLD, PAF on Eliquis  presented to the hospital with complaints of right sided pain. Found to have cholecystitis.  Underwent lap chole on 12/12. Monitor postop recovery.   Assessment and Plan: Acute cholecystitis secondary to gallstone. CT scan with evidence of gallstones Ultrasound and CT scan negative for any CBD dilation. LFTs stable. Currently on IV ceftriaxone. Patient reports that he is allergic to mycin type antibiotic. General surgery consulted. Underwent lap chole on 12/12. Difficult procedure.  EBL 100 mL. Monitor CBC.  Will continue with IV antibiotic per surgery.  Continue with IV fluid.   CAD. Currently no evidence of angina. Monitor.   Paroxysmal A-fib. On anticoagulation with Eliquis . Rate controlled for now. Holding Eliquis  for now. Resume postop as per surgery when ready.   HLD. Statin on hold for now.   Constipation. Patient reports that he has not had a bowel movement in last 2 weeks which was satisfactory. CT scan does not show any evidence of significant stool burden. Continue Senokot for now. Add Gas-X.  OSA. Currently CPAP nightly per home regimen.   Subjective: No nausea no vomiting but feels bloated.  Abdominal pain present but improving.  No BM today.  Physical Exam: Bowel sound present. tender in the right upper quadrant area. S1-S2 present. No edema of lower extremity.  Data Reviewed: I have Reviewed nursing notes, Vitals, and Lab results. Since last encounter, pertinent lab results CBC and BMP   . I have ordered test including CBC and BMP  . I have discussed pt's care plan and test results with general surgery  .    Disposition: Status is: Inpatient Remains inpatient appropriate because: Monitor for postop  recovery  Place and maintain sequential compression device Start: 05/09/24 1521   Family Communication: No one at bedside Level of care: Med-Surg   Vitals:   05/10/24 1400 05/10/24 1415 05/10/24 1430 05/10/24 1448  BP: 130/83 126/79 (!) 137/98 115/87  Pulse: 71 68 65 65  Resp: 15 11 20 18   Temp:   (!) 97.5 F (36.4 C)   TempSrc:    Oral  SpO2: 94% 91% 94% 99%  Weight:      Height:         Author: Yetta Blanch, MD 05/10/2024 5:20 PM  Please look on [email or web address] to find out who is on call.

## 2024-05-10 NOTE — Op Note (Addendum)
 05/10/2024  1:12 PM  PATIENT:  Andrew [name]  68 y.o. male  Patient Care Team: Charlott Dorn LABOR, MD as PCP - General (Internal Medicine) Croitoru, Jerel, MD as PCP - Cardiology (Cardiology) Mealor, Eulas BRAVO, MD as PCP - Electrophysiology (Cardiology)  PRE-OPERATIVE DIAGNOSIS:  CHOLECYSITIS, UMBILICAL HERNIA  POST-OPERATIVE DIAGNOSIS:  GANGRENOUS CHOLECYSTITIS, UMBILICAL HERNIA  PROCEDURE:  LAPAROSCOPIC CHOLECYSTECTOMY WITH INTRAOPERATIVE CHOLANGIOGRAM UMBILICAL HERNIA REPAIR    Surgeon(s): Debby Hila, MD  ASSISTANT: Burnard Banter, PA   ANESTHESIA:   local and general  EBL:148ml  Total I/O In: 0  Out: 100 [Blood:100]  DRAINS: none   SPECIMEN:  Source of Specimen:  gallbladder  DISPOSITION OF SPECIMEN:  PATHOLOGY  COUNTS:  YES  PLAN OF CARE: Patient already admitted  PATIENT DISPOSITION:  PACU - hemodynamically stable.  INDICATION: 68 y.o. M with cholecystitis on elliquis  The anatomy & physiology of hepatobiliary & pancreatic function was discussed.  The pathophysiology of gallbladder dysfunction was discussed.  Natural history risks without surgery was discussed.   I feel the risks of no intervention will lead to serious problems that outweigh the operative risks; therefore, I recommended cholecystectomy to remove the pathology.  I explained laparoscopic techniques with possible need for an open approach.  Probable cholangiogram to evaluate the bilary tract was explained as well.    Risks such as bleeding, infection, abscess, leak, injury to other organs, need for further treatment, heart attack, death, and other risks were discussed.  I noted a good likelihood this will help address the problem.  Possibility that this will not correct all abdominal symptoms was explained.  Goals of post-operative recovery were discussed as well.    OR FINDINGS: gangrenous cholecystitis and gangrenous cystic duct  DESCRIPTION:   The patient was identified & brought into the  operating room. The patient was positioned supine with arms tucked. SCDs were active during the entire case. The patient underwent general anesthesia without any difficulty.  The abdomen was prepped and draped in a sterile fashion. A Surgical Timeout was performed and confirmed our plan.  We positioned the patient in reverse [place] & right side up.  I placed a Hassan laparoscopic port through the umbilicus using open entry technique.  Entry was clean. There were no adhesions to the anterior abdominal wall supraumbilically.  We induced carbon dioxide insufflation. Camera inspection revealed no injury.   I proceeded to continue with laparoscopic technique. I placed a 5 mm port in mid subcostal region, another 5mm port in the right flank near the anterior axillary line, and a 5mm port in the left subxiphoid region obliquely within the falciform ligament.  I turned attention to the right upper quadrant.  The omentum was adherent to the acutely inflamed and distended gallbladder.  The gallbladder was aspirated.  The gallbladder fundus was elevated cephalad. I used blunt dissection to free the peritoneal coverings between the gallbladder and the liver on the posteriolateral and anteriomedial walls.  There was significant inflammation and edema. I used careful blunt and cautery dissection with a suction device to help get a good critical view of the cystic duct.  The proximal portion of the cystic duct was necrotic and fell away from the rest of the duct. I did further dissection to free a few centimeters of the  gallbladder off the liver bed to get a good critical view of the infundibulum and cystic duct. I mobilized the cystic artery as best as I could. There was dense scar and inflammation in the  area.  I skeletonized the remaining cystic duct.  After getting a good 360 view, I decided to perform a cholangiogram.    I placed a 5 F cholangiocatheter through a puncture site at the right subcostal ridge of the  abdominal wall and directed it into the open cystic duct.  This was secured with a clip. We ran a cholangiogram with dilute radio-opaque contrast and continuous fluoroscopy.  Contrast flowed from a side branch consistent with cystic duct cannulization. Contrast flowed up the common hepatic duct into the right and left intrahepatic chains out to secondary radicals. Contrast flowed down the common bile duct easily across the normal ampulla into the duodenum.  This was consistent with a normal cholangiogram.  I removed the cholangiocatheter.  I placed clips on the cystic duct x3.   I placed clips on the cystic artery x3 with 2 proximally.  I ligated the cystic artery using scissors. I freed the gallbladder from its remaining attachments to the liver.  The back wall was necrotic and difficult to mobilize off the liver bed. We were able to get the wall free enough to remove the specimen. There were 2 areas of bleeding on the lateral liver edge.  They were controlled with surgical snow and direct pressure.  I ensured hemostasis elsewhere. I inspected the rest of the abdomen & detected no injury nor bleeding elsewhere.  I irrigated the RUQ with normal saline.  Once I returned, the other 2 areas were hemostatic.  I placed a 65F blake drain and brought this out the patient's right subcostal port site.    I removed the gallbladder through the umbilical port site.  I closed the umbilical fascia and hernia using a 0 Vicryl stitche.   I closed the skin using 4-0 vicryl stitch.  Sterile dressings were applied. The patient was extubated & arrived in the PACU in stable condition.  I had discussed postoperative care with the patient in the holding area.  I will discuss  operative findings and postoperative goals / instructions with the patient's family.  Instructions are written in the chart as well.   Bernarda JAYSON Ned, MD  Colorectal and General Surgery [facility] Surgery

## 2024-05-10 NOTE — Anesthesia Preprocedure Evaluation (Signed)
 Anesthesia Evaluation  Patient identified by MRN, date of birth, ID band Patient awake    Reviewed: Allergy & Precautions, NPO status , Patient's Chart, lab work & pertinent test results  History of Anesthesia Complications (+) DIFFICULT AIRWAY and history of anesthetic complications  Airway Mallampati: IV  TM Distance: >3 FB Neck ROM: Full  Mouth opening: Limited Mouth Opening  Dental  (+) Dental Advisory Given   Pulmonary sleep apnea    breath sounds clear to auscultation       Cardiovascular hypertension, Pt. on home beta blockers and Pt. on medications + CAD and + Cardiac Stents  + dysrhythmias Atrial Fibrillation  Rhythm:Regular Rate:Normal     Neuro/Psych  Neuromuscular disease    GI/Hepatic Neg liver ROS, hiatal hernia,GERD  ,,  Endo/Other  negative endocrine ROS    Renal/GU negative Renal ROS     Musculoskeletal   Abdominal   Peds  Hematology  (+) Blood dyscrasia (Last eliquis  12/10), anemia   Anesthesia Other Findings   Reproductive/Obstetrics                              Anesthesia Physical Anesthesia Plan  ASA: 3  Anesthesia Plan: General   Post-op Pain Management: Tylenol  PO (pre-op )* and Toradol IV (intra-op)*   Induction: Intravenous  PONV Risk Score and Plan: 2 and Dexamethasone , Ondansetron  and Treatment may vary due to age or medical condition  Airway Management Planned: Oral ETT and Video Laryngoscope Planned  Additional Equipment:   Intra-op Plan:   Post-operative Plan: Extubation in OR  Informed Consent: I have reviewed the patients History and Physical, chart, labs and discussed the procedure including the risks, benefits and alternatives for the proposed anesthesia with the patient or authorized representative who has indicated his/her understanding and acceptance.     Dental advisory given  Plan Discussed with: CRNA  Anesthesia Plan Comments:          Anesthesia Quick Evaluation

## 2024-05-10 NOTE — Hospital Course (Signed)
 Patient with PMH of OSA on CPAP, CAD, HLD, PAF on Eliquis  presented to the hospital with complaints of right sided pain. Found to have cholecystitis.  Underwent lap chole on 12/12. Monitor postop recovery.   Assessment and Plan: Acute cholecystitis secondary to gallstone. CT scan with evidence of gallstones Ultrasound and CT scan negative for any CBD dilation. LFTs stable. Currently on IV ceftriaxone. Patient reports that he is allergic to mycin type antibiotic. General surgery consulted. Underwent lap chole on 12/12. Difficult procedure.  EBL 100 mL. Monitor CBC.  Will continue with IV antibiotic per surgery.  Continue with IV fluid.   CAD. Currently no evidence of angina. Monitor.   Paroxysmal A-fib. On anticoagulation with Eliquis . Rate controlled for now. Holding Eliquis  for now. Resume postop as per surgery when ready.   HLD. Statin on hold for now.   Constipation. Patient reports that he has not had a bowel movement in last 2 weeks which was satisfactory. CT scan does not show any evidence of significant stool burden. Continue Senokot for now. Add Gas-X.  OSA. Currently CPAP nightly per home regimen.

## 2024-05-10 NOTE — Plan of Care (Signed)

## 2024-05-11 ENCOUNTER — Encounter (HOSPITAL_COMMUNITY): Payer: Self-pay | Admitting: General Surgery

## 2024-05-11 LAB — CBC WITH DIFFERENTIAL/PLATELET
Abs Immature Granulocytes: 0.03 K/uL (ref 0.00–0.07)
Basophils Absolute: 0 K/uL (ref 0.0–0.1)
Basophils Relative: 0 %
Eosinophils Absolute: 0 K/uL (ref 0.0–0.5)
Eosinophils Relative: 0 %
HCT: 36.8 % — ABNORMAL LOW (ref 39.0–52.0)
Hemoglobin: 12.2 g/dL — ABNORMAL LOW (ref 13.0–17.0)
Immature Granulocytes: 0 %
Lymphocytes Relative: 7 %
Lymphs Abs: 0.7 K/uL (ref 0.7–4.0)
MCH: 29.9 pg (ref 26.0–34.0)
MCHC: 33.2 g/dL (ref 30.0–36.0)
MCV: 90.2 fL (ref 80.0–100.0)
Monocytes Absolute: 0.9 K/uL (ref 0.1–1.0)
Monocytes Relative: 9 %
Neutro Abs: 8.2 K/uL — ABNORMAL HIGH (ref 1.7–7.7)
Neutrophils Relative %: 84 %
Platelets: 197 K/uL (ref 150–400)
RBC: 4.08 MIL/uL — ABNORMAL LOW (ref 4.22–5.81)
RDW: 13.5 % (ref 11.5–15.5)
WBC: 9.9 K/uL (ref 4.0–10.5)
nRBC: 0 % (ref 0.0–0.2)

## 2024-05-11 LAB — COMPREHENSIVE METABOLIC PANEL WITH GFR
ALT: 69 U/L — ABNORMAL HIGH (ref 0–44)
AST: 91 U/L — ABNORMAL HIGH (ref 15–41)
Albumin: 3.5 g/dL (ref 3.5–5.0)
Alkaline Phosphatase: 130 U/L — ABNORMAL HIGH (ref 38–126)
Anion gap: 9 (ref 5–15)
BUN: 11 mg/dL (ref 8–23)
CO2: 24 mmol/L (ref 22–32)
Calcium: 8.6 mg/dL — ABNORMAL LOW (ref 8.9–10.3)
Chloride: 104 mmol/L (ref 98–111)
Creatinine, Ser: 1.04 mg/dL (ref 0.61–1.24)
GFR, Estimated: >60 mL/min (ref >60)
Glucose, Bld: 108 mg/dL — ABNORMAL HIGH (ref 70–99)
Potassium: 4.4 mmol/L (ref 3.5–5.1)
Sodium: 137 mmol/L (ref 135–145)
Total Bilirubin: 0.4 mg/dL (ref 0.0–1.2)
Total Protein: 6.2 g/dL — ABNORMAL LOW (ref 6.5–8.1)

## 2024-05-11 LAB — MAGNESIUM: Magnesium: 2.6 mg/dL — ABNORMAL HIGH (ref 1.7–2.4)

## 2024-05-11 MED ORDER — TRAMADOL HCL 50 MG PO TABS
50.0000 mg | ORAL_TABLET | Freq: Four times a day (QID) | ORAL | Status: DC | PRN
  Administered 2024-05-11 – 2024-05-12 (×4): 100 mg via ORAL
  Filled 2024-05-11 (×4): qty 2

## 2024-05-11 MED ORDER — MELATONIN 5 MG PO TABS
10.0000 mg | ORAL_TABLET | Freq: Every evening | ORAL | Status: DC | PRN
  Administered 2024-05-11: 10 mg via ORAL
  Filled 2024-05-11: qty 2

## 2024-05-11 MED ORDER — ENOXAPARIN SODIUM 40 MG/0.4ML IJ SOSY
40.0000 mg | PREFILLED_SYRINGE | INTRAMUSCULAR | Status: DC
  Administered 2024-05-11: 40 mg via SUBCUTANEOUS
  Filled 2024-05-11: qty 0.4

## 2024-05-11 MED ORDER — BISACODYL 10 MG RE SUPP
10.0000 mg | Freq: Once | RECTAL | Status: AC
  Administered 2024-05-11: 10 mg via RECTAL
  Filled 2024-05-11: qty 1

## 2024-05-11 MED ORDER — OXYCODONE HCL 5 MG PO TABS
5.0000 mg | ORAL_TABLET | ORAL | Status: DC | PRN
  Administered 2024-05-12: 10 mg via ORAL
  Filled 2024-05-11: qty 2

## 2024-05-11 NOTE — Plan of Care (Signed)
 ?  Problem: Clinical Measurements: ?Goal: Will remain free from infection ?Outcome: Progressing ?  ?

## 2024-05-11 NOTE — Progress Notes (Addendum)
 1 Day Post-Op lap chole Subjective: S/p difficult surgery yesterday for gangrenous gallbladder.  Feeling better.  Having some post operative pain not controlled with PO meds, no nausea.  Tolerating a diet.  Ambulating.  C/o constipation  Objective: Vital signs in last 24 hours: Temp:  [97.5 F (36.4 C)-98.6 F (37 C)] 97.7 F (36.5 C) (12/13 0548) Pulse Rate:  [59-80] 59 (12/13 0548) Resp:  [10-20] 16 (12/13 0548) BP: (115-150)/(72-98) 132/84 (12/13 0548) SpO2:  [91 %-100 %] 96 % (12/13 0548) Weight:  [94.3 kg] 94.3 kg (12/12 0955)   Intake/Output from previous day: 12/12 0701 - 12/13 0700 In: 890 [P.O.:790; IV Piggyback:100] Out: 170 [Drains:70; Blood:100] Intake/Output this shift: No intake/output data recorded.   General appearance: alert and cooperative GI: soft, non-distended JP: SS fluid, possibly bilious Incision: no significant drainage, no significant erythema  Lab Results:  Recent Labs    05/10/24 1537 05/11/24 0505  WBC 8.9 9.9  HGB 13.6 12.2*  HCT 41.7 36.8*  PLT 201 197   BMET Recent Labs    05/10/24 0438 05/11/24 0505  NA 140 137  K 4.3 4.4  CL 106 104  CO2 25 24  GLUCOSE 94 108*  BUN 10 11  CREATININE 1.02 1.04  CALCIUM  8.6* 8.6*   PT/INR No results for input(s): LABPROT, INR in the last 72 hours. ABG No results for input(s): PHART, HCO3 in the last 72 hours.  Invalid input(s): PCO2, PO2  MEDS, Scheduled  acetaminophen   1,000 mg Oral Q6H   atorvastatin   80 mg Oral Daily   ezetimibe   10 mg Oral Daily   omega-3 acid ethyl esters  2 g Oral BID   senna-docusate  1 tablet Oral BID   simethicone   80 mg Oral QID    Studies/Results: DG Cholangiogram Operative Result Date: 05/10/2024 CLINICAL DATA:  Elective surgery EXAM: INTRAOPERATIVE CHOLANGIOGRAM TECHNIQUE: Cholangiographic images from the C-arm fluoroscopic device were submitted for interpretation post-operatively. Please see the procedural report for the amount of  contrast and the fluoroscopy time utilized. FLUOROSCOPY: Radiation Exposure Index (as provided by the fluoroscopic device): 3.52 mGy Kerma Fluoroscopy time of 10 seconds COMPARISON:  Ultrasound 05/09/2024 FINDINGS: Cine images of the right upper quadrant are submitted during cystic duct remnant injection post cholecystectomy. The images demonstrate contrast opacification of the intra and extrahepatic bile ducts. No filling defects are seen. There is free flow of contrast into the duodenum. There is mild pooling/extravasation of contrast at the cholecystectomy bed. IMPRESSION: Intraoperative cholangiogram as above. Electronically Signed   By: Luke Bun M.D.   On: 05/10/2024 20:55    Assessment: s/p Procedures: LAPAROSCOPIC CHOLECYSTECTOMY WITH INTRAOPERATIVE CHOLANGIOGRAM REPAIR, HERNIA, UMBILICAL, ADULT Patient Active Problem List   Diagnosis Date Noted   Acute cholecystitis 05/08/2024   Hypercoagulable state due to paroxysmal atrial fibrillation (HCC) 09/15/2023   Hypercholesterolemia 06/18/2023   OSA on CPAP 06/18/2023   Temporomandibular joint syndrome 06/18/2023   Paroxysmal atrial fibrillation (HCC) 02/16/2023   Dyslipidemia (high LDL; low HDL) 02/16/2023   Coronary artery disease of native artery of native heart with stable angina pectoris 02/16/2023   Nocturnal leg cramps 04/04/2017   Peripheral neuropathy 12/05/2016    68 y.o. M on Elliquis s/p difficult cholecystectomy with JP in place  Plan: Cont IV antibiotics today for a total of 5 days Cont ambulation and reg diet Will cont to hold Elliquis today due to significant bleeding intraoperatively Cont JP x 10 days Suppository today for constipation   LOS: 1 day     .  Bernarda JAYSON Ned, MD [facility], [place]    05/11/2024 8:39 AM

## 2024-05-11 NOTE — Progress Notes (Signed)
 [facility] PROGRESS NOTE  Patient: Andrew [name] FMW:[id number]   PCP: Charlott Dorn LABOR, MD DOB: 08-04-1955   DOA: 05/08/2024   DOS: 05/11/2024    Assessment and plan: Care transferred to General surgery after Lap Chole. Appreciate their assistance in care.  Recommend to follow up with pcp in 2 weeks after discharge.   Author: Yetta Blanch, MD [facility] 05/11/2024 9:31 AM   If 7PM-7AM, please contact night-coverage at [email or web address]

## 2024-05-12 LAB — CBC WITH DIFFERENTIAL/PLATELET
Abs Immature Granulocytes: 0.04 K/uL (ref 0.00–0.07)
Basophils Absolute: 0.1 K/uL (ref 0.0–0.1)
Basophils Relative: 1 %
Eosinophils Absolute: 0.3 K/uL (ref 0.0–0.5)
Eosinophils Relative: 5 %
HCT: 38.7 % — ABNORMAL LOW (ref 39.0–52.0)
Hemoglobin: 12.7 g/dL — ABNORMAL LOW (ref 13.0–17.0)
Immature Granulocytes: 1 %
Lymphocytes Relative: 16 %
Lymphs Abs: 1 K/uL (ref 0.7–4.0)
MCH: 29.8 pg (ref 26.0–34.0)
MCHC: 32.8 g/dL (ref 30.0–36.0)
MCV: 90.8 fL (ref 80.0–100.0)
Monocytes Absolute: 0.8 K/uL (ref 0.1–1.0)
Monocytes Relative: 12 %
Neutro Abs: 4.5 K/uL (ref 1.7–7.7)
Neutrophils Relative %: 65 %
Platelets: 205 K/uL (ref 150–400)
RBC: 4.26 MIL/uL (ref 4.22–5.81)
RDW: 13.5 % (ref 11.5–15.5)
WBC: 6.6 K/uL (ref 4.0–10.5)
nRBC: 0 % (ref 0.0–0.2)

## 2024-05-12 LAB — COMPREHENSIVE METABOLIC PANEL WITH GFR
ALT: 64 U/L — ABNORMAL HIGH (ref 0–44)
AST: 71 U/L — ABNORMAL HIGH (ref 15–41)
Albumin: 3.5 g/dL (ref 3.5–5.0)
Alkaline Phosphatase: 125 U/L (ref 38–126)
Anion gap: 8 (ref 5–15)
BUN: 11 mg/dL (ref 8–23)
CO2: 28 mmol/L (ref 22–32)
Calcium: 9 mg/dL (ref 8.9–10.3)
Chloride: 101 mmol/L (ref 98–111)
Creatinine, Ser: 1.18 mg/dL (ref 0.61–1.24)
GFR, Estimated: >60 mL/min (ref >60)
Glucose, Bld: 99 mg/dL (ref 70–99)
Potassium: 4.7 mmol/L (ref 3.5–5.1)
Sodium: 136 mmol/L (ref 135–145)
Total Bilirubin: 0.4 mg/dL (ref 0.0–1.2)
Total Protein: 6.4 g/dL — ABNORMAL LOW (ref 6.5–8.1)

## 2024-05-12 LAB — MAGNESIUM: Magnesium: 2.3 mg/dL (ref 1.7–2.4)

## 2024-05-12 MED ORDER — APIXABAN 5 MG PO TABS
5.0000 mg | ORAL_TABLET | Freq: Two times a day (BID) | ORAL | Status: DC
  Administered 2024-05-12: 5 mg via ORAL
  Filled 2024-05-12: qty 1

## 2024-05-12 MED ORDER — DIPHENHYDRAMINE HCL 25 MG PO CAPS
25.0000 mg | ORAL_CAPSULE | Freq: Four times a day (QID) | ORAL | Status: DC | PRN
  Administered 2024-05-12: 25 mg via ORAL
  Filled 2024-05-12: qty 1

## 2024-05-12 MED ORDER — AMOXICILLIN-POT CLAVULANATE 875-125 MG PO TABS
1.0000 | ORAL_TABLET | Freq: Two times a day (BID) | ORAL | Status: DC
  Administered 2024-05-12: 1 via ORAL
  Filled 2024-05-12: qty 1

## 2024-05-12 MED ORDER — OXYCODONE HCL 5 MG PO TABS: 5.0000 mg | ORAL_TABLET | ORAL | 0 refills | Status: AC | PRN

## 2024-05-12 MED ORDER — ALUM & MAG HYDROXIDE-SIMETH 200-200-20 MG/5ML PO SUSP
30.0000 mL | ORAL | Status: DC | PRN
  Administered 2024-05-12: 30 mL via ORAL
  Filled 2024-05-12: qty 30

## 2024-05-12 MED ORDER — AMOXICILLIN-POT CLAVULANATE 875-125 MG PO TABS: 1.0000 | ORAL_TABLET | Freq: Two times a day (BID) | ORAL | 0 refills | Status: AC

## 2024-05-12 MED ORDER — PANTOPRAZOLE SODIUM 40 MG PO TBEC
40.0000 mg | DELAYED_RELEASE_TABLET | Freq: Every day | ORAL | Status: DC
  Administered 2024-05-12: 40 mg via ORAL
  Filled 2024-05-12: qty 1

## 2024-05-12 NOTE — TOC Transition Note (Signed)
 Transition of Care [facility]) - Discharge Note   Patient Details  Name: Andrew [name] MRN: [id number] Date of Birth: May 27, 1956  [facility] CM/SW Contact:  Sonda Manuella Quill, RN Phone Number: 05/12/2024, 9:50 AM   Clinical Narrative:    D/C orders received; no IP CM needs.   Final next level of care: Home/Self Care Barriers to Discharge: No Barriers Identified   Patient Goals and CMS Choice Patient states their goals for this hospitalization and ongoing recovery are:: return home          Discharge Placement                       Discharge Plan and Services Additional resources added to the After Visit Summary for                  DME Arranged: N/A DME Agency: NA       HH Arranged: NA HH Agency: NA        Social Drivers of Health (SDOH) Interventions SDOH Screenings   Food Insecurity: No Food Insecurity (05/08/2024)  Housing: Low Risk (05/08/2024)  Transportation Needs: No Transportation Needs (05/08/2024)  Utilities: Not At Risk (05/08/2024)  Social Connections: Socially Integrated (05/08/2024)  Tobacco Use: Low Risk (05/10/2024)     Readmission Risk Interventions     No data to display

## 2024-05-12 NOTE — Progress Notes (Signed)
 Reviewed JP drain care and other written d/c instructions w pt, all questions answered, he verbalized understanding. D/C via w/c w all belongings in stable condition.

## 2024-05-12 NOTE — Plan of Care (Signed)

## 2024-05-12 NOTE — Discharge Summary (Signed)
 Physician Discharge Summary  Patient ID: Andrew [name] MRN: [id number] DOB/AGE: 06-May-1956 68 y.o.  Admit date: 05/08/2024 Discharge date: 05/12/2024  Admission Diagnoses-: Patient Active Problem List   Diagnosis Date Noted   **Acute cholecystitis** - primary diagnosis 05/08/2024   Hypercoagulable state due to paroxysmal atrial fibrillation (HCC) 09/15/2023   Hypercholesterolemia 06/18/2023   OSA on CPAP 06/18/2023   Temporomandibular joint syndrome 06/18/2023   Paroxysmal atrial fibrillation (HCC) 02/16/2023   Dyslipidemia (high LDL; low HDL) 02/16/2023   Coronary artery disease of native artery of native heart with stable angina pectoris 02/16/2023   Nocturnal leg cramps 04/04/2017   Peripheral neuropathy 12/05/2016    Discharge Diagnoses:  Principal Problem:   Acute cholecystitis And same as above  Discharged Condition: stable  Hospital Course:  Patient was admitted to the floor with right upper quadrant pain that was intermittent for 2 weeks but worsened and brought him to the emergency department on December 10.  Workup showed cholecystitis.  He underwent laparoscopic cholecystectomy with umbilical hernia repair by Dr. Debby on May 10, 2024.  He was found to have gangrenous cholecystitis at that time.  Previously present umbilical hernia was repaired on the way out.  The patient is on Eliquis  and the operation was complicated by oozing throughout the case.  This was able to be controlled prior to completion of the case.  Given the gangrenous nature of the gallbladder and the oozing, a drain was left in the operation.  On postop day 1, the patient was not quite ready to go due to need for additional IV antibiotics.  He was discharged home on postop day 2 in stable condition.  He is ambulatory and able to have a diet.  Pain was controlled with oral medications.  He was urinating spontaneously.  He was taught drain management.  Consults: None  Significant Diagnostic Studies:  Hematocrit prior to discharge was 38.7 which was stable.  Creatinine was 1.18.  Treatments: surgery: see above  Discharge Exam: Blood pressure (!) 131/94, pulse 66, temperature 98.3 F (36.8 C), temperature source Oral, resp. rate 16, height 6' 2 (1.88 m), weight 94.3 kg, SpO2 97%. General appearance: alert, cooperative, and no distress Resp: breathing comfortably GI: soft, sl protuberant, some bruising at periumbilical incision.  No drainage.  JP serosang  Disposition: Discharge disposition: 01-Home or Self Care       Discharge Instructions     Call MD for:  difficulty breathing, headache or visual disturbances   Complete by: As directed    Call MD for:  hives   Complete by: As directed    Call MD for:  persistant nausea and vomiting   Complete by: As directed    Call MD for:  redness, tenderness, or signs of infection (pain, swelling, redness, odor or green/yellow discharge around incision site)   Complete by: As directed    Call MD for:  severe uncontrolled pain   Complete by: As directed    Call MD for:  temperature >100.4   Complete by: As directed    Change dressing (specify)   Complete by: As directed    Measure and record drain output 1-2 times per day. Bring record to clinic.   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   atorvastatin  80 MG tablet Commonly known as: LIPITOR Take 1 tablet (80 mg total) by mouth daily.   cetirizine 10 MG tablet Commonly known as: ZYRTEC Take 10 mg by mouth daily as needed for allergies.    doxycycline 100 MG tablet Commonly known as: ADOXA Take 100 mg by mouth daily as needed (Rosacea).   Eliquis  5 MG Tabs tablet Generic drug: apixaban  TAKE 1 TABLET BY MOUTH TWICE A DAY   ezetimibe  10 MG tablet Commonly known as: ZETIA  TAKE 1 TABLET BY MOUTH EVERY DAY   Fish Oil 1200 MG Caps Take 1,200 mg by mouth 2 (two) times daily.   fluticasone 50 MCG/ACT nasal spray Commonly known as: FLONASE Place 2 sprays into both nostrils daily as needed for allergies or rhinitis.   gabapentin  300 MG capsule Commonly known as: NEURONTIN  Take 300-600 mg by mouth 3 (three) times daily as needed (pain).   L-Lysine 1000 MG Tabs Take 500 mg by mouth 2 (two) times daily.   Melatonin 10 MG Caps Take 10 mg by mouth at bedtime.   metoprolol  succinate 25 MG 24 hr tablet Commonly known as: TOPROL -XL TAKE 1 TABLET (25 MG TOTAL) BY MOUTH DAILY.   nitroGLYCERIN  0.4 MG SL tablet Commonly known as: NITROSTAT  Place 1 tablet (0.4 mg total) under the tongue every 5 (five) minutes as needed for chest pain.   oxyCODONE  5 MG immediate release tablet Commonly known as: Oxy IR/ROXICODONE  Take 1-2 tablets (5-10 mg total) by mouth every 4 (four) hours as needed for severe pain (pain score 7-10) (5mg  moderate, 10mg  severe).   pantoprazole  40 MG tablet Commonly known as: Protonix  Take 1 tablet (40 mg total) by mouth every Monday, Wednesday, and Friday.   CENTRUM SILVER PO Take 1 tablet by mouth daily.   PRESERVISION AREDS 2+MULTI VIT PO Take 1 tablet by mouth 2 (two) times daily.   traZODone  50 MG tablet Commonly known as: DESYREL  Take 50-100 mg by mouth at bedtime as needed for sleep.   tretinoin 0.1 % cream Commonly known as: RETIN-A Apply 1 Application topically at bedtime as needed (rosacea).   Vitamin D3 125 MCG (5000 UT) Caps Take 5,000 Units by mouth 3 (three) times a week. Earlean Everts, and [id number]               Discharge Care Instructions  (From admission, onward)            Start     Ordered   05/12/24 0000  Change dressing (specify)       Comments: Measure and record drain output 1-2 times per day. Bring record to clinic.   05/12/24 0922            Follow-up Information     Maczis, Tonja Barban, PA-C Follow up on 05/24/2024.   Specialty: General Surgery Why: 9:45pm, Arrive 15 minutes prior to your appointment time, Please bring your insurance card and photo ID Contact information: [address] [facility] [place] [address] [phone]         [facility] [place] Follow up on 05/20/2024.   Specialty: General Surgery Why: This is follow up for your drain to be removed.  Office will call you with a follow up appointment, If you don't hear from the office, please call, Arrive 30 minutes prior to your appointment time, Please bring your insurance card and photo ID Contact information: [address] [place] [address] [phone]         Charlott Dorn LABOR, MD. Schedule an appointment as soon as possible for a visit in 2 week(s).   Specialty: Internal Medicine Why: with BMP lab to look at kidney/electrolyte numbers, with CBC lab to look at blood counts Contact information: [address] [place] [address] [phone]                 Signed: Jina Nephew 05/12/2024, 7:32 PM

## 2024-05-12 NOTE — Plan of Care (Signed)

## 2024-05-13 LAB — SURGICAL PATHOLOGY

## 2024-05-15 ENCOUNTER — Other Ambulatory Visit: Payer: Self-pay | Admitting: Cardiovascular Disease

## 2024-05-16 ENCOUNTER — Other Ambulatory Visit (HOSPITAL_COMMUNITY): Payer: Self-pay

## 2024-05-16 MED ORDER — PANTOPRAZOLE SODIUM 40 MG PO TBEC
40.0000 mg | DELAYED_RELEASE_TABLET | ORAL | 0 refills | Status: AC
  Filled 2024-05-16: qty 36, 84d supply, fill #0

## 2024-05-20 NOTE — Progress Notes (Signed)
 Pt presented today for removal of JP drain from right abd. Patient s/p lap chole  by Dr. ILEEN on 05/10/2024 . No signs of infection. Incision was prepped with alcohol. Drain removed successfully, and pt tolerated well. Reinforced wound with 4x4 gauze and compression tape. Let pt know it is safe to shower and change dressing daily. Advised pt to look for any signs of infection: swelling, increased redness, odorous drainage, major pain, or fever/chills and to call with any concerns or future questions. Pt verbalized understanding.      Next appointment on 05/24/2024 at 945 am with Puja for Post Op appointment.     HM
